# Patient Record
Sex: Female | Born: 1950 | Race: White | Hispanic: No | Smoking: Former smoker
Health system: Southern US, Community
[De-identification: ages and names within clinical notes are randomized; demographics above are authoritative.]

## PROBLEM LIST (undated history)

## (undated) DIAGNOSIS — M1990 Unspecified osteoarthritis, unspecified site: Secondary | ICD-10-CM

## (undated) DIAGNOSIS — F32A Depression, unspecified: Secondary | ICD-10-CM

## (undated) DIAGNOSIS — F419 Anxiety disorder, unspecified: Secondary | ICD-10-CM

## (undated) DIAGNOSIS — Z8719 Personal history of other diseases of the digestive system: Secondary | ICD-10-CM

## (undated) DIAGNOSIS — K219 Gastro-esophageal reflux disease without esophagitis: Secondary | ICD-10-CM

## (undated) DIAGNOSIS — J449 Chronic obstructive pulmonary disease, unspecified: Secondary | ICD-10-CM

## (undated) DIAGNOSIS — G2581 Restless legs syndrome: Secondary | ICD-10-CM

## (undated) HISTORY — PX: BREAST SURGERY: SHX581

## (undated) HISTORY — PX: JOINT REPLACEMENT: SHX530

## (undated) HISTORY — PX: CHOLECYSTECTOMY: SHX55

## (undated) HISTORY — PX: TONSILLECTOMY: SUR1361

---

## 1988-05-08 HISTORY — PX: BREAST EXCISIONAL BIOPSY: SUR124

## 2005-01-31 ENCOUNTER — Ambulatory Visit: Payer: Self-pay | Admitting: Cardiology

## 2010-07-19 ENCOUNTER — Ambulatory Visit (INDEPENDENT_AMBULATORY_CARE_PROVIDER_SITE_OTHER): Payer: Self-pay | Admitting: Internal Medicine

## 2010-08-11 ENCOUNTER — Ambulatory Visit (INDEPENDENT_AMBULATORY_CARE_PROVIDER_SITE_OTHER): Payer: Self-pay | Admitting: Internal Medicine

## 2019-09-17 ENCOUNTER — Other Ambulatory Visit: Payer: Self-pay

## 2019-09-17 ENCOUNTER — Ambulatory Visit (INDEPENDENT_AMBULATORY_CARE_PROVIDER_SITE_OTHER): Payer: Medicare Other | Admitting: Orthopaedic Surgery

## 2019-09-17 VITALS — Ht 66.0 in | Wt 205.0 lb

## 2019-09-17 DIAGNOSIS — M25551 Pain in right hip: Secondary | ICD-10-CM | POA: Diagnosis not present

## 2019-09-17 NOTE — Progress Notes (Signed)
Office Visit Note   Patient: [name]           Date of Birth: 1951/03/25           MRN: [id number] Visit Date: 09/17/2019              Requested by: No referring provider defined for this encounter. PCP: Glenda Chroman, MD   Assessment & Plan: Visit Diagnoses:  1. 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  I do feel it would be worthwhile trying an intra-articular steroid injection under ultrasound and I will refer her to Dr. Junius Roads for this.  He can then get her back to me about 2 weeks after that injection.  She agrees with this treatment plan.  I have encouraged her to use the cane in her opposite hand as well.  All questions and concerns were answered and addressed.  If she continues to have severe pain after the injection in her hip, we will consider a MRI of the right hip to better assess the degree of arthritis she has.  Follow-Up Instructions: Return in about 4 weeks (around 10/15/2019).   Orders:  No orders of the defined types were placed in this encounter.  No orders of the defined types were placed in this encounter.     Procedures: No procedures performed   Clinical Data: No additional findings.   Subjective: Chief Complaint  Patient presents with  . Right Hip - Pain  The patient is someone I am seeing for the first time.  She has had right hip pain for only about 3 and half months and is here to discuss surgery although we do not truly know the diagnosis.  There are x-rays on the canopy system for me to review.  She has a history of chronic bilateral knee pain.  She has had an injection over the trochanteric area of her right hip but she does report more pain in her groin.  She ambulates using a cane but on the same side as her right hip pain.  Again her hip pain is only been acute just since February with no known injury.  She is not a diabetic  HPI  Review of  Systems She currently denies any headache, chest pain, shortness of breath, fever, chills, nausea, vomiting  Objective: Vital Signs: Ht 5\' 6"  (1.676 m)   Wt 205 lb (93 kg)   BMI 33.09 kg/m   Physical Exam She is alert and orient x3 and in no acute distress Ortho Exam Examination of both hips shows that I can easily move into internal and external rotation with no blocks to rotation.  She has a lot of pain out of proportion of exam when I put her hip on the right side the range of motion on not sure if some of this is over the trochanteric area and not truly in the groin.  Again, there are no blocks to rotation of the right hip.  There is no stiffness. Specialty Comments:  No specialty comments available.  Imaging: No results found. I did review x-rays independently on the canopy system of her hips.  There is only minimal arthritic findings in the right hip with a very small periarticular osteophyte and just a touch of joint space narrowing.  The femoral head is nice and round and congruent with the joint itself.  PMFS History: There are no problems to display for this patient.  No past medical history on file.  No family history on file.   Social History   Occupational History  . Not on file  Tobacco Use  . Smoking status: Not on file  Substance and Sexual Activity  . Alcohol use: Not on file  . Drug use: Not on file  . Sexual activity: Not on file

## 2019-09-22 ENCOUNTER — Ambulatory Visit: Payer: Medicare Other | Admitting: Family Medicine

## 2019-09-26 ENCOUNTER — Ambulatory Visit: Payer: Medicare Other | Admitting: Family Medicine

## 2019-09-29 ENCOUNTER — Ambulatory Visit (INDEPENDENT_AMBULATORY_CARE_PROVIDER_SITE_OTHER): Payer: Medicare Other | Admitting: Family Medicine

## 2019-09-29 ENCOUNTER — Other Ambulatory Visit: Payer: Self-pay

## 2019-09-29 ENCOUNTER — Ambulatory Visit: Payer: Self-pay

## 2019-09-29 ENCOUNTER — Encounter: Payer: Self-pay | Admitting: Family Medicine

## 2019-09-29 DIAGNOSIS — M25551 Pain in right hip: Secondary | ICD-10-CM

## 2019-09-29 NOTE — Progress Notes (Signed)
Subjective: Patient is here for ultrasound-guided intra-articular right hip injection.   She is here for a diagnostic injection.  She had a greater trochanter injection in the past which only helped during the anesthetic phase.  She complains of a lot of pain in the groin area.  Objective: She does have substantial pain with passive flexion and internal rotation.  Procedure: Ultrasound-guided right hip injection: After sterile prep with Betadine, injected 8 cc 1% lidocaine without epinephrine and 40 mg methylprednisolone using a 22-gauge spinal needle, passing the needle through the iliofemoral ligament into the femoral head/neck junction.  Injectate was seen filling the joint capsule.  She had a small joint effusion at the time of the injection.  She had very good immediate relief.  She will follow-up with Dr. Magnus Ivan as scheduled.

## 2019-10-29 ENCOUNTER — Ambulatory Visit (INDEPENDENT_AMBULATORY_CARE_PROVIDER_SITE_OTHER): Payer: Medicare Other | Admitting: Orthopaedic Surgery

## 2019-10-29 ENCOUNTER — Other Ambulatory Visit: Payer: Self-pay

## 2019-10-29 ENCOUNTER — Ambulatory Visit: Payer: Self-pay

## 2019-10-29 ENCOUNTER — Ambulatory Visit (INDEPENDENT_AMBULATORY_CARE_PROVIDER_SITE_OTHER): Payer: Medicare Other

## 2019-10-29 DIAGNOSIS — M25561 Pain in right knee: Secondary | ICD-10-CM

## 2019-10-29 DIAGNOSIS — M25562 Pain in left knee: Secondary | ICD-10-CM

## 2019-10-29 DIAGNOSIS — G8929 Other chronic pain: Secondary | ICD-10-CM

## 2019-10-29 DIAGNOSIS — M1711 Unilateral primary osteoarthritis, right knee: Secondary | ICD-10-CM | POA: Diagnosis not present

## 2019-10-29 DIAGNOSIS — M1712 Unilateral primary osteoarthritis, left knee: Secondary | ICD-10-CM

## 2019-10-29 NOTE — Progress Notes (Signed)
Office Visit Note   Patient: [name]           Date of Birth: 1951-03-31           MRN: [id number] Visit Date: 10/29/2019              Requested by: Glenda Chroman, MD [place],  [place] [address] PCP: Glenda Chroman, MD   Assessment & Plan: Visit Diagnoses:  1. Chronic pain of left knee   2. Chronic pain of right knee   3. Unilateral primary osteoarthritis, left knee   4. Unilateral primary osteoarthritis, right knee     Plan: Given the severity of the arthritis in her left knee combined with the significant pain on clinical exam and her x-ray findings showing severe end-stage arthritis with medial compartment bone-on-bone wear, we are recommending a total knee arthroplasty for the left knee.  She may need this for the right knee in the future but right now the right knee is more unstable to her that it is painful.  I have recommended a left total knee arthroplasty.  I showed her knee replacement model and explained in detail the interoperative and postoperative course.  I talked about the risk and benefits of surgery in detail.  All questions and concerns were answered and addressed.  We will work on getting this scheduled for her left knee in the near future.  Follow-Up Instructions: Return for 2 weeks post-op.   Orders:  Orders Placed This Encounter  Procedures  . XR Knee 1-2 Views Left  . XR Knee 1-2 Views Right   No orders of the defined types were placed in this encounter.     Procedures: No procedures performed   Clinical Data: No additional findings.   Subjective: Chief Complaint  Patient presents with  . Right Hip - Follow-up  . Right Knee - Pain  . Left Knee - Pain  The patient comes in today with chief complaint of bilateral knee pain with the left knee much worse than the right knee in terms of pain.  The right knee has more instability than pain.  She has had numerous steroid injections in her knees over the past.  She ambulates using a  cane.  She is also had bilateral knee hyaluronic acid injections.  At this point her knee pain is become daily and it is detrimentally affecting her mobility, her quality of life and her actives daily living.  She is work on activity modification as well as weight loss and has been to therapy with quad strengthening exercises.  She says the injections not helping anymore and her pain is daily and is hurting basically all the time.  It is 10 out of 10 in terms of her left knee pain.  This is worsened over the last 12 months.  She is not on any blood thinning medication.  She is otherwise healthy 69 year old female who wants to be active again.  HPI  Review of Systems She currently denies any headache, chest pain, shortness of breath, fever, chills, nausea, vomiting  Objective: Vital Signs: There were no vitals taken for this visit.  Physical Exam She is alert and oriented x3 and in no acute distress Ortho Exam Examination of both knees shows varus malalignment.  The left knee is much more tender along the medial joint line but the right knee does have medial joint line tenderness.  Both knees are ligamentously stable.  Both knees have significant patellofemoral crepitation.  Specialty Comments:  No specialty comments available.  Imaging: XR Knee 1-2 Views Left  Result Date: 10/29/2019 2 views of the left knee show severe end-stage arthritis with varus malalignment, complete loss of medial joint space and large peritracheal osteophytes.  XR Knee 1-2 Views Right  Result Date: 10/29/2019 2 views of the right knee show severe arthritic changes with varus malalignment, medial joint space narrowing with almost bone-on-bone wear and large periarticular osteophytes.    PMFS History: Patient Active Problem List   Diagnosis Date Noted  . Unilateral primary osteoarthritis, left knee 10/29/2019  . Unilateral primary osteoarthritis, right knee 10/29/2019   No past medical history on file.  No  family history on file.  No past surgical history on file. Social History   Occupational History  . Not on file  Tobacco Use  . Smoking status: Not on file  Substance and Sexual Activity  . Alcohol use: Not on file  . Drug use: Not on file  . Sexual activity: Not on file

## 2019-10-30 ENCOUNTER — Other Ambulatory Visit: Payer: Self-pay

## 2019-12-05 ENCOUNTER — Other Ambulatory Visit: Payer: Self-pay | Admitting: Physician Assistant

## 2019-12-12 ENCOUNTER — Other Ambulatory Visit (HOSPITAL_COMMUNITY)
Admission: RE | Admit: 2019-12-12 | Discharge: 2019-12-12 | Disposition: A | Discharge status: Home / Self Care | Payer: Medicare Other | Source: Ambulatory Visit | Attending: Orthopaedic Surgery | Admitting: Orthopaedic Surgery

## 2019-12-12 ENCOUNTER — Encounter (HOSPITAL_COMMUNITY)
Admission: RE | Admit: 2019-12-12 | Discharge: 2019-12-12 | Disposition: A | Discharge status: Home / Self Care | Payer: Medicare Other | Source: Ambulatory Visit | Attending: Orthopaedic Surgery | Admitting: Orthopaedic Surgery

## 2019-12-12 ENCOUNTER — Encounter (HOSPITAL_COMMUNITY): Payer: Self-pay

## 2019-12-12 ENCOUNTER — Other Ambulatory Visit: Payer: Self-pay

## 2019-12-12 DIAGNOSIS — Z20822 Contact with and (suspected) exposure to covid-19: Secondary | ICD-10-CM | POA: Insufficient documentation

## 2019-12-12 DIAGNOSIS — Z01812 Encounter for preprocedural laboratory examination: Secondary | ICD-10-CM | POA: Insufficient documentation

## 2019-12-12 HISTORY — DX: Unspecified osteoarthritis, unspecified site: M19.90

## 2019-12-12 HISTORY — DX: Gastro-esophageal reflux disease without esophagitis: K21.9

## 2019-12-12 HISTORY — DX: Depression, unspecified: F32.A

## 2019-12-12 HISTORY — DX: Chronic obstructive pulmonary disease, unspecified: J44.9

## 2019-12-12 HISTORY — DX: Restless legs syndrome: G25.81

## 2019-12-12 HISTORY — DX: Anxiety disorder, unspecified: F41.9

## 2019-12-12 HISTORY — DX: Personal history of other diseases of the digestive system: Z87.19

## 2019-12-12 LAB — SURGICAL PCR SCREEN
MRSA, PCR: NEGATIVE
Staphylococcus aureus: POSITIVE — AB

## 2019-12-12 LAB — TYPE AND SCREEN
ABO/RH(D): A POS
Antibody Screen: NEGATIVE

## 2019-12-12 LAB — CBC
HCT: 41.9 % (ref 36.0–46.0)
Hemoglobin: 14.3 g/dL (ref 12.0–15.0)
MCH: 31.8 pg (ref 26.0–34.0)
MCHC: 34.1 g/dL (ref 30.0–36.0)
MCV: 93.1 fL (ref 80.0–100.0)
Platelets: UNDETERMINED 10*3/uL (ref 150–400)
RBC: 4.5 MIL/uL (ref 3.87–5.11)
RDW: 12.2 % (ref 11.5–15.5)
WBC: 8 10*3/uL (ref 4.0–10.5)
nRBC: 0 % (ref 0.0–0.2)

## 2019-12-12 LAB — BASIC METABOLIC PANEL
Anion gap: 11 (ref 5–15)
BUN: 14 mg/dL (ref 8–23)
CO2: 25 mmol/L (ref 22–32)
Calcium: 9.8 mg/dL (ref 8.9–10.3)
Chloride: 105 mmol/L (ref 98–111)
Creatinine, Ser: 0.85 mg/dL (ref 0.44–1.00)
GFR calc Af Amer: >60 mL/min (ref >60)
GFR calc non Af Amer: >60 mL/min (ref >60)
Glucose, Bld: 90 mg/dL (ref 70–99)
Potassium: 3.9 mmol/L (ref 3.5–5.1)
Sodium: 141 mmol/L (ref 135–145)

## 2019-12-12 LAB — SARS CORONAVIRUS 2 (TAT 6-24 HRS): SARS Coronavirus 2: NEGATIVE

## 2019-12-12 NOTE — Progress Notes (Signed)
PCP - DR Sherril Croon  IN [place]    AWARE OF SURGERY Cardiologist - NA     Chest x-ray - NA EKG - NA     Sleep Study - YES CPAP -  NO    Aspirin Instructions:STOP  ERAS Protcol -YES PRE-SURGERY Ensure or G2- INSTRUCTIONS GIVEN  COVID TEST- DONE TODAY   Anesthesia review: NA  Patient denies shortness of breath, fever, cough and chest pain at PAT appointment   All instructions explained to the patient, with a verbal understanding of the material. Patient agrees to go over the instructions while at home for a better understanding. Patient also instructed to self quarantine after being tested for COVID-19. The opportunity to ask questions was provided.

## 2019-12-12 NOTE — Pre-Procedure Instructions (Signed)
Rhae Lerner [facility]  12/12/2019      [facility] [address] [place] - [address] [place] [address] Phone: [phone] Fax: [phone]  [facility] [place], [place] - [address] [place], [address] [place], [address] [place]  [address] Phone: [phone] Fax: [phone]    Your procedure is scheduled on 12/16/19.  Report to [facility] Admitting at 10 A.M.  Call this number if you have problems the morning of surgery:  [phone]   Remember:  Do not eat or drink after midnight.  You may drink clear liquids until 9 am .  Clear liquids allowed are:                    Water, Juice (non-citric and without pulp - diabetics please choose diet or no sugar options), Carbonated beverages - (diabetics please choose diet or no sugar options), Clear Tea, Black Coffee only (no creamer, milk or cream including half and half) and Plain Jell-O only (diabetics please choose diet or no sugar options)    Take these medicines the morning of surgery with A SIP OF WATER ---  TYLENOL,WELLBUTRIN,HYDROCODONE,PROTONIX    Do not wear jewelry, make-up or nail polish.  Do not wear lotions, powders, or perfumes, or deodorant.  Do not shave 48 hours prior to surgery.  Men may shave face and neck.  Do not bring valuables to the hospital.  [facility] is not responsible for any belongings or valuables.  Contacts, dentures or bridgework may not be worn into surgery.  Leave your suitcase in the car.  After surgery it may be brought to your room.  For patients admitted to the hospital, discharge time will be determined by your treatment team.  Patients discharged the day of surgery will not be allowed to drive home.    Special instructions:  Do not take any aspirin,anti-inflammatories,vitamins,or herbal supplements 5-7 days prior to surgery.Please complete your PRE-SURGERY ENSURE that was provided to you by ... the morning of surgery.   Please, if able, drink it in one setting. DO NOT SIP.[place] - Preparing for Surgery  Before surgery, you can play an important role.  Because skin is not sterile, your skin needs to be as free of germs as possible.  You can reduce the number of germs on you skin by washing with CHG (chlorahexidine gluconate) soap before surgery.  CHG is an antiseptic cleaner which kills germs and bonds with the skin to continue killing germs even after washing.  Oral Hygiene is also important in reducing the risk of infection.  Remember to brush your teeth with your regular toothpaste the morning of surgery.  Please DO NOT use if you have an allergy to CHG or antibacterial soaps.  If your skin becomes reddened/irritated stop using the CHG and inform your nurse when you arrive at Short Stay.  Do not shave (including legs and underarms) for at least 48 hours prior to the first CHG shower.  You may shave your face.  Please follow these instructions carefully:   1.  Shower with CHG Soap the night before surgery and the morning of Surgery.  2.  If you choose to wash your hair, wash your hair first as usual with your normal shampoo.  3.  After you shampoo, rinse your hair and body thoroughly to remove the shampoo. 4.  Use CHG as you would any other liquid soap.  You  can apply chg directly to the skin and wash gently with a      scrungie or washcloth.           5.  Apply the CHG Soap to your body ONLY FROM THE NECK DOWN.   Do not use on open wounds or open sores. Avoid contact with your eyes, ears, mouth and genitals (private parts).  Wash genitals (private parts) with your normal soap.  6.  Wash thoroughly, paying special attention to the area where your surgery will be performed.  7.  Thoroughly rinse your body with warm water from the neck down.  8.  DO NOT shower/wash with your normal soap after using and rinsing off the CHG Soap.  9.  Pat yourself dry with a clean towel.            10.  Wear clean pajamas.             11.  Place clean sheets on your bed the night of your first shower and do not sleep with pets.  Day of Surgery  Do not apply any lotions/deoderants the morning of surgery.   Please wear clean clothes to the hospital/surgery center. Remember to brush your teeth with toothpaste.    Please read over the following fact sheets that you were given. Coughing and Deep Breathing

## 2019-12-15 NOTE — Anesthesia Preprocedure Evaluation (Addendum)
Anesthesia Evaluation  Patient identified by MRN, date of birth, ID band Patient awake    Reviewed: Allergy & Precautions, NPO status , Patient's Chart, lab work & pertinent test results  Airway Mallampati: II  TM Distance: >3 FB Neck ROM: Full    Dental no notable dental hx. (+) Teeth Intact, Dental Advisory Given   Pulmonary COPD,  COPD inhaler, Current Smoker and Patient abstained from smoking.,  Current smoker, 20 pack year history- 4 cigg/d Nebulizer PRN- last used about 6 mo ago   Pulmonary exam normal breath sounds clear to auscultation       Cardiovascular negative cardio ROS Normal cardiovascular exam Rhythm:Regular Rate:Normal     Neuro/Psych PSYCHIATRIC DISORDERS Anxiety Depression Dementia negative neurological ROS     GI/Hepatic Neg liver ROS, hiatal hernia, GERD  Medicated and Controlled,  Endo/Other  Obesity BMI 33  Renal/GU negative Renal ROS  negative genitourinary   Musculoskeletal  (+) Arthritis , Osteoarthritis,    Abdominal   Peds  Hematology hct 41.9, plt clumped on initial CBC- will redraw this AM   Anesthesia Other Findings   Reproductive/Obstetrics negative OB ROS                            Anesthesia Physical Anesthesia Plan  ASA: II  Anesthesia Plan: MAC and General   Post-op Pain Management:  Regional for Post-op pain   Induction:   PONV Risk Score and Plan: 2 and Ondansetron, Dexamethasone, Treatment may vary due to age or medical condition and Midazolam  Airway Management Planned: Oral ETT  Additional Equipment: None  Intra-op Plan:   Post-operative Plan: Extubation in OR  Informed Consent: I have reviewed the patients History and Physical, chart, labs and discussed the procedure including the risks, benefits and alternatives for the proposed anesthesia with the patient or authorized representative who has indicated his/her understanding and  acceptance.     Dental advisory given  Plan Discussed with: CRNA  Anesthesia Plan Comments: (Pt electing for GA/ETT)       Anesthesia Quick Evaluation

## 2019-12-16 ENCOUNTER — Encounter (HOSPITAL_COMMUNITY): Payer: Self-pay | Admitting: Orthopaedic Surgery

## 2019-12-16 ENCOUNTER — Ambulatory Visit (HOSPITAL_COMMUNITY): Payer: Medicare Other | Admitting: Anesthesiology

## 2019-12-16 ENCOUNTER — Encounter (HOSPITAL_COMMUNITY)
Admission: RE | Disposition: A | Discharge status: Home Health | Payer: Self-pay | Source: Home / Self Care | Attending: Orthopaedic Surgery

## 2019-12-16 ENCOUNTER — Inpatient Hospital Stay (HOSPITAL_COMMUNITY)
Admission: RE | Admit: 2019-12-16 | Discharge: 2019-12-18 | DRG: 470 | Disposition: A | Discharge status: Home Health | Payer: Medicare Other | Attending: Orthopaedic Surgery | Admitting: Orthopaedic Surgery

## 2019-12-16 ENCOUNTER — Observation Stay (HOSPITAL_COMMUNITY): Payer: Medicare Other

## 2019-12-16 ENCOUNTER — Other Ambulatory Visit: Payer: Self-pay

## 2019-12-16 DIAGNOSIS — E669 Obesity, unspecified: Secondary | ICD-10-CM | POA: Diagnosis present

## 2019-12-16 DIAGNOSIS — M1712 Unilateral primary osteoarthritis, left knee: Secondary | ICD-10-CM | POA: Diagnosis not present

## 2019-12-16 DIAGNOSIS — F329 Major depressive disorder, single episode, unspecified: Secondary | ICD-10-CM | POA: Diagnosis present

## 2019-12-16 DIAGNOSIS — K219 Gastro-esophageal reflux disease without esophagitis: Secondary | ICD-10-CM | POA: Diagnosis present

## 2019-12-16 DIAGNOSIS — Z20822 Contact with and (suspected) exposure to covid-19: Secondary | ICD-10-CM | POA: Diagnosis present

## 2019-12-16 DIAGNOSIS — F17210 Nicotine dependence, cigarettes, uncomplicated: Secondary | ICD-10-CM | POA: Diagnosis present

## 2019-12-16 DIAGNOSIS — G2581 Restless legs syndrome: Secondary | ICD-10-CM | POA: Diagnosis present

## 2019-12-16 DIAGNOSIS — Z23 Encounter for immunization: Secondary | ICD-10-CM

## 2019-12-16 DIAGNOSIS — Z6832 Body mass index (BMI) 32.0-32.9, adult: Secondary | ICD-10-CM

## 2019-12-16 DIAGNOSIS — J449 Chronic obstructive pulmonary disease, unspecified: Secondary | ICD-10-CM | POA: Diagnosis present

## 2019-12-16 DIAGNOSIS — Z96652 Presence of left artificial knee joint: Secondary | ICD-10-CM

## 2019-12-16 HISTORY — PX: TOTAL KNEE ARTHROPLASTY: SHX125

## 2019-12-16 LAB — ABO/RH: ABO/RH(D): A POS

## 2019-12-16 SURGERY — ARTHROPLASTY, KNEE, TOTAL
Anesthesia: Monitor Anesthesia Care | Site: Knee | Laterality: Left

## 2019-12-16 MED ORDER — HYDROMORPHONE HCL 1 MG/ML IJ SOLN
INTRAMUSCULAR | Status: AC
  Filled 2019-12-16: qty 0.5

## 2019-12-16 MED ORDER — LACTATED RINGERS IV SOLN
INTRAVENOUS | Status: DC | PRN
Start: 2019-12-16 — End: 2019-12-16

## 2019-12-16 MED ORDER — TRANEXAMIC ACID-NACL 1000-0.7 MG/100ML-% IV SOLN
1000.0000 mg | INTRAVENOUS | Status: AC
  Administered 2019-12-16: 1000 mg via INTRAVENOUS
  Filled 2019-12-16: qty 100

## 2019-12-16 MED ORDER — VITAMIN D 25 MCG (1000 UNIT) PO TABS
2000.0000 [IU] | ORAL_TABLET | Freq: Every day | ORAL | Status: DC
  Administered 2019-12-16 – 2019-12-18 (×3): 2000 [IU] via ORAL
  Filled 2019-12-16 (×5): qty 2

## 2019-12-16 MED ORDER — ONDANSETRON HCL 4 MG/2ML IJ SOLN: 4.0000 mg | Freq: Four times a day (QID) | INTRAMUSCULAR | Status: DC | PRN

## 2019-12-16 MED ORDER — SODIUM CHLORIDE 0.9 % IV SOLN: INTRAVENOUS | Status: DC

## 2019-12-16 MED ORDER — MIDAZOLAM HCL 2 MG/2ML IJ SOLN
INTRAMUSCULAR | Status: DC | PRN
  Administered 2019-12-16 (×2): 1 mg via INTRAVENOUS

## 2019-12-16 MED ORDER — OXYCODONE HCL 5 MG PO TABS
10.0000 mg | ORAL_TABLET | ORAL | Status: DC | PRN
  Administered 2019-12-16: 10 mg via ORAL
  Administered 2019-12-17 – 2019-12-18 (×8): 15 mg via ORAL
  Filled 2019-12-16 (×8): qty 3

## 2019-12-16 MED ORDER — KETOROLAC TROMETHAMINE 15 MG/ML IJ SOLN
15.0000 mg | Freq: Four times a day (QID) | INTRAMUSCULAR | Status: AC
  Administered 2019-12-16 – 2019-12-17 (×4): 15 mg via INTRAVENOUS
  Filled 2019-12-16 (×4): qty 1

## 2019-12-16 MED ORDER — PROMETHAZINE HCL 25 MG/ML IJ SOLN
6.2500 mg | INTRAMUSCULAR | Status: DC | PRN
  Administered 2019-12-16: 12.5 mg via INTRAVENOUS

## 2019-12-16 MED ORDER — OXYCODONE HCL 5 MG PO TABS
5.0000 mg | ORAL_TABLET | ORAL | Status: DC | PRN
  Administered 2019-12-16 – 2019-12-17 (×2): 10 mg via ORAL
  Filled 2019-12-16 (×3): qty 2

## 2019-12-16 MED ORDER — OXYCODONE HCL 5 MG PO TABS: 5.0000 mg | ORAL_TABLET | Freq: Once | ORAL | Status: DC | PRN

## 2019-12-16 MED ORDER — FENTANYL CITRATE (PF) 250 MCG/5ML IJ SOLN
INTRAMUSCULAR | Status: AC
  Filled 2019-12-16: qty 5

## 2019-12-16 MED ORDER — CEFAZOLIN SODIUM-DEXTROSE 1-4 GM/50ML-% IV SOLN
1.0000 g | Freq: Four times a day (QID) | INTRAVENOUS | Status: AC
  Administered 2019-12-16: 1 g via INTRAVENOUS
  Filled 2019-12-16 (×2): qty 50

## 2019-12-16 MED ORDER — ACETAMINOPHEN 500 MG PO TABS
1000.0000 mg | ORAL_TABLET | Freq: Once | ORAL | Status: AC
  Administered 2019-12-16: 1000 mg via ORAL
  Filled 2019-12-16: qty 2

## 2019-12-16 MED ORDER — ACETAMINOPHEN 325 MG PO TABS: 325.0000 mg | ORAL_TABLET | Freq: Four times a day (QID) | ORAL | Status: DC | PRN

## 2019-12-16 MED ORDER — ONDANSETRON HCL 4 MG/2ML IJ SOLN
INTRAMUSCULAR | Status: DC | PRN
  Administered 2019-12-16: 4 mg via INTRAVENOUS

## 2019-12-16 MED ORDER — HYDROMORPHONE HCL 1 MG/ML IJ SOLN
INTRAMUSCULAR | Status: DC | PRN
  Administered 2019-12-16: .5 mg via INTRAVENOUS

## 2019-12-16 MED ORDER — HYDROMORPHONE HCL 1 MG/ML IJ SOLN
0.2500 mg | INTRAMUSCULAR | Status: DC | PRN
  Administered 2019-12-16 (×2): 0.5 mg via INTRAVENOUS

## 2019-12-16 MED ORDER — CEFAZOLIN SODIUM-DEXTROSE 2-4 GM/100ML-% IV SOLN
2.0000 g | INTRAVENOUS | Status: AC
  Administered 2019-12-16: 2 g via INTRAVENOUS
  Filled 2019-12-16: qty 100

## 2019-12-16 MED ORDER — CHLORHEXIDINE GLUCONATE 0.12 % MT SOLN
OROMUCOSAL | Status: AC
  Administered 2019-12-16: 15 mL
  Filled 2019-12-16: qty 15

## 2019-12-16 MED ORDER — POVIDONE-IODINE 10 % EX SWAB: 2.0000 "application " | Freq: Once | CUTANEOUS | Status: DC

## 2019-12-16 MED ORDER — MUPIROCIN 2 % EX OINT
1.0000 "application " | TOPICAL_OINTMENT | Freq: Two times a day (BID) | CUTANEOUS | Status: DC
  Administered 2019-12-16 – 2019-12-18 (×4): 1 via NASAL
  Filled 2019-12-16: qty 22

## 2019-12-16 MED ORDER — BACLOFEN 10 MG PO TABS
10.0000 mg | ORAL_TABLET | Freq: Two times a day (BID) | ORAL | Status: DC | PRN
  Administered 2019-12-16 – 2019-12-17 (×2): 10 mg via ORAL
  Filled 2019-12-16 (×4): qty 1

## 2019-12-16 MED ORDER — BUPROPION HCL ER (XL) 150 MG PO TB24
150.0000 mg | ORAL_TABLET | Freq: Every morning | ORAL | Status: DC
  Administered 2019-12-17 – 2019-12-18 (×2): 150 mg via ORAL
  Filled 2019-12-16 (×2): qty 1

## 2019-12-16 MED ORDER — PANTOPRAZOLE SODIUM 40 MG PO TBEC
40.0000 mg | DELAYED_RELEASE_TABLET | Freq: Every day | ORAL | Status: DC
  Administered 2019-12-16 – 2019-12-18 (×3): 40 mg via ORAL
  Filled 2019-12-16 (×3): qty 1

## 2019-12-16 MED ORDER — METOCLOPRAMIDE HCL 5 MG/ML IJ SOLN: 5.0000 mg | Freq: Three times a day (TID) | INTRAMUSCULAR | Status: DC | PRN

## 2019-12-16 MED ORDER — CHLORHEXIDINE GLUCONATE CLOTH 2 % EX PADS
6.0000 | MEDICATED_PAD | Freq: Every day | CUTANEOUS | Status: DC
  Administered 2019-12-17 – 2019-12-18 (×2): 6 via TOPICAL

## 2019-12-16 MED ORDER — ONDANSETRON HCL 4 MG PO TABS: 4.0000 mg | ORAL_TABLET | Freq: Four times a day (QID) | ORAL | Status: DC | PRN

## 2019-12-16 MED ORDER — HYDROMORPHONE HCL 1 MG/ML IJ SOLN
INTRAMUSCULAR | Status: AC
  Administered 2019-12-16: 0.5 mg via INTRAVENOUS
  Filled 2019-12-16: qty 1

## 2019-12-16 MED ORDER — ROSUVASTATIN CALCIUM 5 MG PO TABS
10.0000 mg | ORAL_TABLET | ORAL | Status: DC
  Administered 2019-12-17: 10 mg via ORAL
  Filled 2019-12-16: qty 2

## 2019-12-16 MED ORDER — MIDAZOLAM HCL 2 MG/2ML IJ SOLN
INTRAMUSCULAR | Status: AC
  Filled 2019-12-16: qty 2

## 2019-12-16 MED ORDER — FENTANYL CITRATE (PF) 250 MCG/5ML IJ SOLN
INTRAMUSCULAR | Status: DC | PRN
  Administered 2019-12-16 (×2): 25 ug via INTRAVENOUS
  Administered 2019-12-16 (×2): 50 ug via INTRAVENOUS
  Administered 2019-12-16: 100 ug via INTRAVENOUS

## 2019-12-16 MED ORDER — ROPINIROLE HCL 0.5 MG PO TABS
1.0000 mg | ORAL_TABLET | Freq: Two times a day (BID) | ORAL | Status: DC
  Administered 2019-12-16 – 2019-12-18 (×4): 1 mg via ORAL
  Filled 2019-12-16 (×4): qty 2

## 2019-12-16 MED ORDER — 0.9 % SODIUM CHLORIDE (POUR BTL) OPTIME
TOPICAL | Status: DC | PRN
  Administered 2019-12-16: 1000 mL

## 2019-12-16 MED ORDER — METOCLOPRAMIDE HCL 5 MG PO TABS: 5.0000 mg | ORAL_TABLET | Freq: Three times a day (TID) | ORAL | Status: DC | PRN

## 2019-12-16 MED ORDER — PROMETHAZINE HCL 25 MG/ML IJ SOLN
INTRAMUSCULAR | Status: AC
  Filled 2019-12-16: qty 1

## 2019-12-16 MED ORDER — FENTANYL CITRATE (PF) 100 MCG/2ML IJ SOLN
INTRAMUSCULAR | Status: AC
  Administered 2019-12-16: 25 ug via INTRAVENOUS
  Filled 2019-12-16: qty 2

## 2019-12-16 MED ORDER — PNEUMOCOCCAL VAC POLYVALENT 25 MCG/0.5ML IJ INJ
0.5000 mL | INJECTION | INTRAMUSCULAR | Status: AC
  Administered 2019-12-18: 0.5 mL via INTRAMUSCULAR
  Filled 2019-12-16: qty 0.5

## 2019-12-16 MED ORDER — PROPOFOL 10 MG/ML IV BOLUS
INTRAVENOUS | Status: DC | PRN
  Administered 2019-12-16: 150 mg via INTRAVENOUS

## 2019-12-16 MED ORDER — PHENYLEPHRINE 40 MCG/ML (10ML) SYRINGE FOR IV PUSH (FOR BLOOD PRESSURE SUPPORT)
PREFILLED_SYRINGE | INTRAVENOUS | Status: DC | PRN
  Administered 2019-12-16: 80 ug via INTRAVENOUS

## 2019-12-16 MED ORDER — PROPOFOL 10 MG/ML IV BOLUS
INTRAVENOUS | Status: AC
  Filled 2019-12-16: qty 20

## 2019-12-16 MED ORDER — PHENOL 1.4 % MT LIQD: 1.0000 | OROMUCOSAL | Status: DC | PRN

## 2019-12-16 MED ORDER — LIDOCAINE 2% (20 MG/ML) 5 ML SYRINGE
INTRAMUSCULAR | Status: DC | PRN
  Administered 2019-12-16: 20 mg via INTRAVENOUS

## 2019-12-16 MED ORDER — ALUM & MAG HYDROXIDE-SIMETH 200-200-20 MG/5ML PO SUSP: 30.0000 mL | ORAL | Status: DC | PRN

## 2019-12-16 MED ORDER — OXYCODONE HCL 5 MG PO TABS
ORAL_TABLET | ORAL | Status: AC
  Administered 2019-12-16: 10 mg via ORAL
  Filled 2019-12-16: qty 2

## 2019-12-16 MED ORDER — ROCURONIUM BROMIDE 10 MG/ML (PF) SYRINGE
PREFILLED_SYRINGE | INTRAVENOUS | Status: DC | PRN
  Administered 2019-12-16: 50 mg via INTRAVENOUS

## 2019-12-16 MED ORDER — CARBIDOPA-LEVODOPA 25-100 MG PO TABS
1.0000 | ORAL_TABLET | Freq: Two times a day (BID) | ORAL | Status: DC
  Administered 2019-12-16 – 2019-12-18 (×4): 1 via ORAL
  Filled 2019-12-16 (×4): qty 1

## 2019-12-16 MED ORDER — FENTANYL CITRATE (PF) 100 MCG/2ML IJ SOLN
INTRAMUSCULAR | Status: AC
  Filled 2019-12-16: qty 2

## 2019-12-16 MED ORDER — FENTANYL CITRATE (PF) 100 MCG/2ML IJ SOLN
25.0000 ug | INTRAMUSCULAR | Status: DC | PRN
  Administered 2019-12-16: 25 ug via INTRAVENOUS

## 2019-12-16 MED ORDER — SUGAMMADEX SODIUM 200 MG/2ML IV SOLN
INTRAVENOUS | Status: DC | PRN
  Administered 2019-12-16: 150 mg via INTRAVENOUS

## 2019-12-16 MED ORDER — DEXAMETHASONE SODIUM PHOSPHATE 10 MG/ML IJ SOLN
INTRAMUSCULAR | Status: DC | PRN
Start: 2019-12-16 — End: 2019-12-16
  Administered 2019-12-16: 10 mg

## 2019-12-16 MED ORDER — ACETAMINOPHEN 500 MG PO TABS
500.0000 mg | ORAL_TABLET | Freq: Three times a day (TID) | ORAL | Status: DC | PRN
  Administered 2019-12-17: 1000 mg via ORAL
  Filled 2019-12-16: qty 2

## 2019-12-16 MED ORDER — DEXAMETHASONE SODIUM PHOSPHATE 10 MG/ML IJ SOLN
INTRAMUSCULAR | Status: DC | PRN
  Administered 2019-12-16: 5 mg

## 2019-12-16 MED ORDER — LORAZEPAM 0.5 MG PO TABS
0.5000 mg | ORAL_TABLET | Freq: Two times a day (BID) | ORAL | Status: DC
  Administered 2019-12-16 – 2019-12-18 (×4): 0.5 mg via ORAL
  Filled 2019-12-16 (×4): qty 1

## 2019-12-16 MED ORDER — ROPIVACAINE HCL 5 MG/ML IJ SOLN
INTRAMUSCULAR | Status: DC | PRN
Start: 2019-12-16 — End: 2019-12-16
  Administered 2019-12-16: 30 mL via PERINEURAL

## 2019-12-16 MED ORDER — DOCUSATE SODIUM 100 MG PO CAPS
100.0000 mg | ORAL_CAPSULE | Freq: Two times a day (BID) | ORAL | Status: DC
  Administered 2019-12-16 – 2019-12-18 (×4): 100 mg via ORAL
  Filled 2019-12-16 (×4): qty 1

## 2019-12-16 MED ORDER — HYDROMORPHONE HCL 1 MG/ML IJ SOLN
0.5000 mg | INTRAMUSCULAR | Status: DC | PRN
  Administered 2019-12-17 – 2019-12-18 (×3): 1 mg via INTRAVENOUS
  Filled 2019-12-16 (×3): qty 1

## 2019-12-16 MED ORDER — DIPHENHYDRAMINE HCL 12.5 MG/5ML PO ELIX: 12.5000 mg | ORAL_SOLUTION | ORAL | Status: DC | PRN

## 2019-12-16 MED ORDER — ASPIRIN 81 MG PO CHEW
81.0000 mg | CHEWABLE_TABLET | Freq: Two times a day (BID) | ORAL | Status: DC
  Administered 2019-12-16 – 2019-12-18 (×4): 81 mg via ORAL
  Filled 2019-12-16 (×4): qty 1

## 2019-12-16 MED ORDER — POLYETHYLENE GLYCOL 3350 17 G PO PACK: 17.0000 g | PACK | Freq: Every day | ORAL | Status: DC | PRN

## 2019-12-16 MED ORDER — SODIUM CHLORIDE 0.9 % IR SOLN
Status: DC | PRN
  Administered 2019-12-16: 3000 mL

## 2019-12-16 MED ORDER — OXYCODONE HCL 5 MG/5ML PO SOLN: 5.0000 mg | Freq: Once | ORAL | Status: DC | PRN

## 2019-12-16 MED ORDER — MENTHOL 3 MG MT LOZG: 1.0000 | LOZENGE | OROMUCOSAL | Status: DC | PRN

## 2019-12-16 SURGICAL SUPPLY — 71 items
BANDAGE ESMARK 6X9 LF (GAUZE/BANDAGES/DRESSINGS) ×1 IMPLANT
BEARIN INSERT TIBIAL SZ 3 11 (Insert) ×2 IMPLANT
BEARING INSERT TIBIAL SZ 3 11 (Insert) IMPLANT
BLADE SAG 18X100X1.27 (BLADE) ×2 IMPLANT
BNDG CMPR 9X6 STRL LF SNTH (GAUZE/BANDAGES/DRESSINGS) ×1
BNDG ELASTIC 6X5.8 VLCR STR LF (GAUZE/BANDAGES/DRESSINGS) ×2 IMPLANT
BNDG ESMARK 6X9 LF (GAUZE/BANDAGES/DRESSINGS) ×2
BOWL SMART MIX CTS (DISPOSABLE) ×2 IMPLANT
BSPLAT TIB 3 KN TRITANIUM (Knees) ×1 IMPLANT
COVER SURGICAL LIGHT HANDLE (MISCELLANEOUS) ×2 IMPLANT
COVER WAND RF STERILE (DRAPES) ×2 IMPLANT
CUFF TOURN SGL QUICK 34 (TOURNIQUET CUFF) ×2
CUFF TOURN SGL QUICK 42 (TOURNIQUET CUFF) IMPLANT
CUFF TRNQT CYL 34X4.125X (TOURNIQUET CUFF) ×1 IMPLANT
DRAPE EXTREMITY T 121X128X90 (DISPOSABLE) ×2 IMPLANT
DRAPE HALF SHEET 40X57 (DRAPES) ×2 IMPLANT
DRAPE U-SHAPE 47X51 STRL (DRAPES) ×2 IMPLANT
DRSG AQUACEL AG ADV 3.5X10 (GAUZE/BANDAGES/DRESSINGS) ×1 IMPLANT
DRSG PAD ABDOMINAL 8X10 ST (GAUZE/BANDAGES/DRESSINGS) ×2 IMPLANT
DRSG XEROFORM 1X8 (GAUZE/BANDAGES/DRESSINGS) ×2 IMPLANT
DURAPREP 26ML APPLICATOR (WOUND CARE) ×2 IMPLANT
ELECT CAUTERY BLADE 6.4 (BLADE) ×2 IMPLANT
ELECT REM PT RETURN 9FT ADLT (ELECTROSURGICAL) ×2
ELECTRODE REM PT RTRN 9FT ADLT (ELECTROSURGICAL) ×1 IMPLANT
FACESHIELD WRAPAROUND (MASK) ×6 IMPLANT
FEMORAL POSTERIOR SZ3 LT KNEE (Orthopedic Implant) IMPLANT
GAUZE SPONGE 4X4 12PLY STRL (GAUZE/BANDAGES/DRESSINGS) ×2 IMPLANT
GAUZE SPONGE 4X4 12PLY STRL LF (GAUZE/BANDAGES/DRESSINGS) ×1 IMPLANT
GAUZE XEROFORM 1X8 LF (GAUZE/BANDAGES/DRESSINGS) ×2 IMPLANT
GLOVE BIO SURGEON STRL SZ7.5 (GLOVE) ×2 IMPLANT
GLOVE BIOGEL PI IND STRL 8 (GLOVE) ×2 IMPLANT
GLOVE BIOGEL PI INDICATOR 8 (GLOVE) ×2
GLOVE ORTHO TXT STRL SZ7.5 (GLOVE) ×2 IMPLANT
GLOVE SURG ORTHO 8.0 STRL STRW (GLOVE) ×5 IMPLANT
GLOVE SURG SS PI 6.5 STRL IVOR (GLOVE) ×2 IMPLANT
GOWN STRL REUS W/ TWL LRG LVL3 (GOWN DISPOSABLE) IMPLANT
GOWN STRL REUS W/ TWL XL LVL3 (GOWN DISPOSABLE) ×2 IMPLANT
GOWN STRL REUS W/TWL LRG LVL3 (GOWN DISPOSABLE)
GOWN STRL REUS W/TWL XL LVL3 (GOWN DISPOSABLE) ×4
HANDPIECE INTERPULSE COAX TIP (DISPOSABLE) ×2
IMMOBILIZER KNEE 22 UNIV (SOFTGOODS) ×2 IMPLANT
KIT BASIN OR (CUSTOM PROCEDURE TRAY) ×2 IMPLANT
KIT TURNOVER KIT B (KITS) ×2 IMPLANT
KNEE PATELLA ASYMMETRIC 9X29 (Knees) ×1 IMPLANT
KNEE TIBIAL COMPONENT SZ3 (Knees) ×1 IMPLANT
MANIFOLD NEPTUNE II (INSTRUMENTS) ×2 IMPLANT
NEEDLE 18GX1X1/2 (RX/OR ONLY) (NEEDLE) IMPLANT
NS IRRIG 1000ML POUR BTL (IV SOLUTION) ×2 IMPLANT
PACK TOTAL JOINT (CUSTOM PROCEDURE TRAY) ×2 IMPLANT
PAD ABD 8X10 STRL (GAUZE/BANDAGES/DRESSINGS) ×1 IMPLANT
PAD ARMBOARD 7.5X6 YLW CONV (MISCELLANEOUS) ×2 IMPLANT
PADDING CAST COTTON 6X4 STRL (CAST SUPPLIES) ×2 IMPLANT
POSTERIOR FEMORAL SZ3 LT KNEE (Orthopedic Implant) ×2 IMPLANT
SET HNDPC FAN SPRY TIP SCT (DISPOSABLE) ×1 IMPLANT
SET PAD KNEE POSITIONER (MISCELLANEOUS) ×3 IMPLANT
STAPLER VISISTAT 35W (STAPLE) ×2 IMPLANT
STRIP CLOSURE SKIN 1/2X4 (GAUZE/BANDAGES/DRESSINGS) IMPLANT
SUCTION FRAZIER HANDLE 10FR (MISCELLANEOUS) ×2
SUCTION TUBE FRAZIER 10FR DISP (MISCELLANEOUS) ×1 IMPLANT
SUT MNCRL AB 4-0 PS2 18 (SUTURE) IMPLANT
SUT VIC AB 0 CT1 27 (SUTURE) ×2
SUT VIC AB 0 CT1 27XBRD ANBCTR (SUTURE) ×1 IMPLANT
SUT VIC AB 1 CT1 27 (SUTURE) ×4
SUT VIC AB 1 CT1 27XBRD ANBCTR (SUTURE) ×2 IMPLANT
SUT VIC AB 2-0 CT1 27 (SUTURE) ×4
SUT VIC AB 2-0 CT1 TAPERPNT 27 (SUTURE) ×2 IMPLANT
SYR 50ML LL SCALE MARK (SYRINGE) IMPLANT
TOWEL GREEN STERILE (TOWEL DISPOSABLE) ×2 IMPLANT
TOWEL GREEN STERILE FF (TOWEL DISPOSABLE) ×2 IMPLANT
TRAY CATH 16FR W/PLASTIC CATH (SET/KITS/TRAYS/PACK) IMPLANT
WRAP KNEE MAXI GEL POST OP (GAUZE/BANDAGES/DRESSINGS) ×2 IMPLANT

## 2019-12-16 NOTE — Anesthesia Procedure Notes (Signed)
Anesthesia Regional Block: Adductor canal block   Pre-Anesthetic Checklist: ,, timeout performed, Correct Patient, Correct Site, Correct Laterality, Correct Procedure, Correct Position, site marked, Risks and benefits discussed,  Surgical consent,  Pre-op evaluation,  At surgeon's request and post-op pain management  Laterality: Left  Prep: Maximum Sterile Barrier Precautions used, chloraprep       Needles:  Injection technique: Single-shot  Needle Type: Echogenic Stimulator Needle     Needle Length: 9cm  Needle Gauge: 22     Additional Needles:   Procedures:,,,, ultrasound used (permanent image in chart),,,,  Narrative:  Start time: 12/16/2019 11:32 AM End time: 12/16/2019 11:37 AM Injection made incrementally with aspirations every 5 mL.  Performed by: Personally  Anesthesiologist: Lannie Fields, DO  Additional Notes: Monitors applied. No increased pain on injection. No increased resistance to injection. Injection made in 5cc increments. Good needle visualization. Patient tolerated procedure well.

## 2019-12-16 NOTE — Anesthesia Postprocedure Evaluation (Signed)
Anesthesia Post Note  Patient: [name]  Procedure(s) Performed: LEFT TOTAL KNEE ARTHROPLASTY (Left Knee)     Patient location during evaluation: PACU Anesthesia Type: General and Regional Level of consciousness: awake and alert, oriented and patient cooperative Pain management: pain level controlled Vital Signs Assessment: post-procedure vital signs reviewed and stable Respiratory status: spontaneous breathing, nonlabored ventilation and respiratory function stable Cardiovascular status: blood pressure returned to baseline and stable Postop Assessment: no apparent nausea or vomiting Anesthetic complications: no   No complications documented.  Last Vitals:  Vitals:   12/16/19 1530 12/16/19 1545  BP: (!) 154/84   Pulse: 92 88  Resp: 16 12  Temp:    SpO2: 100% 100%    Last Pain:  Vitals:   12/16/19 1545  TempSrc:   PainSc: Asleep                 Lannie Fields

## 2019-12-16 NOTE — Brief Op Note (Signed)
12/16/2019  1:34 PM  PATIENT:  [name]  69 y.o. female  PRE-OPERATIVE DIAGNOSIS:  osteoarthritis left knee  POST-OPERATIVE DIAGNOSIS:  osteoarthritis left knee  PROCEDURE:  Procedure(s): LEFT TOTAL KNEE ARTHROPLASTY (Left)  SURGEON:  Surgeon(s) and Role:    Kathryne Hitch, MD - Primary  PHYSICIAN ASSISTANT:  Rexene Edison, PA-C  ANESTHESIA:   regional and general  EBL:  10 mL   TOURNIQUET:   Total Tourniquet Time Documented: Thigh (Left) - 43 minutes Total: Thigh (Left) - 43 minutes   DICTATION: .Other Dictation: Dictation Number [id number]  PLAN OF CARE: Admit for overnight observation  PATIENT DISPOSITION:  PACU - hemodynamically stable.   Delay start of Pharmacological VTE agent (>24hrs) due to surgical blood loss or risk of bleeding: no

## 2019-12-16 NOTE — H&P (Signed)
TOTAL KNEE ADMISSION H&P  Patient is being admitted for left total knee arthroplasty.  Subjective:  Chief Complaint:left knee pain.  HPI: [name], 69 y.o. female, has a history of pain and functional disability in the left knee due to arthritis and has failed non-surgical conservative treatments for greater than 12 weeks to includeNSAID's and/or analgesics, corticosteriod injections, viscosupplementation injections, flexibility and strengthening excercises, use of assistive devices and activity modification.  Onset of symptoms was gradual, starting 3 years ago with gradually worsening course since that time. The patient noted no past surgery on the left knee(s).  Patient currently rates pain in the left knee(s) at 10 out of 10 with activity. Patient has night pain, worsening of pain with activity and weight bearing, pain that interferes with activities of daily living, pain with passive range of motion, crepitus and joint swelling.  Patient has evidence of subchondral sclerosis, periarticular osteophytes and joint space narrowing by imaging studies. There is no active infection.  Patient Active Problem List   Diagnosis Date Noted   Unilateral primary osteoarthritis, left knee 10/29/2019   Unilateral primary osteoarthritis, right knee 10/29/2019   Past Medical History:  Diagnosis Date   Anxiety    Arthritis    COPD (chronic obstructive pulmonary disease) (HCC)    Depression    GERD (gastroesophageal reflux disease)    History of hiatal hernia    RLS (restless legs syndrome)     Past Surgical History:  Procedure Laterality Date   BREAST SURGERY     lumpectomy   CHOLECYSTECTOMY     TONSILLECTOMY      Current Facility-Administered Medications  Medication Dose Route Frequency Provider Last Rate Last Admin   ceFAZolin (ANCEF) IVPB 2g/100 mL premix  2 g Intravenous On Call to OR Kirtland Bouchard, PA-C       povidone-iodine 10 % swab 2 application  2 application  Topical Once Richardean Canal W, PA-C       tranexamic acid (CYKLOKAPRON) IVPB 1,000 mg  1,000 mg Intravenous To OR Kirtland Bouchard, PA-C       Facility-Administered Medications Ordered in Other Encounters  Medication Dose Route Frequency Provider Last Rate Last Admin   fentaNYL citrate (PF) (SUBLIMAZE) injection   Intravenous Anesthesia Intra-op Muqtasid, Samantha T, CRNA   50 mcg at 12/16/19 1134   midazolam (VERSED) injection   Intravenous Anesthesia Intra-op Muqtasid, Samantha T, CRNA   1 mg at 12/16/19 1135   No Known Allergies  Social History   Tobacco Use   Smoking status: Current Every Day Smoker    Packs/day: 0.50    Years: 40.00    Pack years: 20.00   Smokeless tobacco: Never Used  Substance Use Topics   Alcohol use: Never    History reviewed. No pertinent family history.   Review of Systems  Musculoskeletal: Positive for joint swelling.  All other systems reviewed and are negative.   Objective:  Physical Exam Vitals reviewed.  Constitutional:      Appearance: Normal appearance.  HENT:     Head: Normocephalic and atraumatic.  Eyes:     Pupils: Pupils are equal, round, and reactive to light.  Cardiovascular:     Rate and Rhythm: Normal rate.     Pulses: Normal pulses.  Pulmonary:     Effort: Pulmonary effort is normal.  Abdominal:     Palpations: Abdomen is soft.  Musculoskeletal:     Cervical back: Normal range of motion.     Left knee: Swelling, effusion  and bony tenderness present. Decreased range of motion. Tenderness present over the medial joint line, lateral joint line and patellar tendon. Abnormal alignment.  Neurological:     Mental Status: She is alert and oriented to person, place, and time.  Psychiatric:        Behavior: Behavior normal.     Vital signs in last 24 hours: Temp:  [97.7 F (36.5 C)] 97.7 F (36.5 C) (08/10 1013) Pulse Rate:  [84] 84 (08/10 1013) Resp:  [18] 18 (08/10 1013) BP: (146)/(78) 146/78 (08/10 1013) SpO2:   [96 %] 96 % (08/10 1013) Weight:  [92.5 kg] 92.5 kg (08/10 1013)  Labs:   Estimated body mass index is 32.93 kg/m as calculated from the following:   Height as of this encounter: 5\' 6"  (1.676 m).   Weight as of this encounter: 92.5 kg.   Imaging Review Plain radiographs demonstrate severe degenerative joint disease of the left knee(s). The overall alignment ismild varus. The bone quality appears to be good for age and reported activity level.      Assessment/Plan:  End stage arthritis, left knee   The patient history, physical examination, clinical judgment of the provider and imaging studies are consistent with end stage degenerative joint disease of the left knee(s) and total knee arthroplasty is deemed medically necessary. The treatment options including medical management, injection therapy arthroscopy and arthroplasty were discussed at length. The risks and benefits of total knee arthroplasty were presented and reviewed. The risks due to aseptic loosening, infection, stiffness, patella tracking problems, thromboembolic complications and other imponderables were discussed. The patient acknowledged the explanation, agreed to proceed with the plan and consent was signed. Patient is being admitted for inpatient treatment for surgery, pain control, PT, OT, prophylactic antibiotics, VTE prophylaxis, progressive ambulation and ADL's and discharge planning. The patient is planning to be discharged home with home health services

## 2019-12-16 NOTE — Transfer of Care (Signed)
Immediate Anesthesia Transfer of Care Note  Patient: [name]  Procedure(s) Performed: LEFT TOTAL KNEE ARTHROPLASTY (Left Knee)  Patient Location: PACU  Anesthesia Type:General and GA combined with regional for post-op pain  Level of Consciousness: drowsy and patient cooperative  Airway & Oxygen Therapy: Patient Spontanous Breathing  Post-op Assessment: Report given to RN, Post -op Vital signs reviewed and stable and Patient moving all extremities X 4  Post vital signs: Reviewed and stable  Last Vitals:  Vitals Value Taken Time  BP    Temp    Pulse    Resp    SpO2      Last Pain:  Vitals:   12/16/19 1021  TempSrc:   PainSc: 8       Patients Stated Pain Goal: 3 (12/16/19 1021)  Complications: No complications documented.

## 2019-12-16 NOTE — Anesthesia Procedure Notes (Signed)
Procedure Name: Intubation Date/Time: 12/16/2019 12:31 PM Performed by: Darletta Moll, CRNA Pre-anesthesia Checklist: Patient identified, Emergency Drugs available, Suction available and Patient being monitored Patient Re-evaluated:Patient Re-evaluated prior to induction Oxygen Delivery Method: Circle system utilized Preoxygenation: Pre-oxygenation with 100% oxygen Induction Type: IV induction Ventilation: Mask ventilation without difficulty Laryngoscope Size: Mac and 3 Grade View: Grade I Tube type: Oral Tube size: 7.5 mm Number of attempts: 1 Airway Equipment and Method: Stylet and Oral airway Placement Confirmation: positive ETCO2,  breath sounds checked- equal and bilateral and CO2 detector Secured at: 21 cm Tube secured with: Tape Dental Injury: Teeth and Oropharynx as per pre-operative assessment

## 2019-12-16 NOTE — Progress Notes (Signed)
Orthopedic Tech Progress Note Patient Details:  Christelle Igoe 1951-03-19 [id number] Applied CPM while patient was in PACU. Had RN lift leg and she was already in pain so I knew she wasn't going to reach 60 degrees  CPM Left Knee CPM Left Knee: On Left Knee Flexion (Degrees): 35 Left Knee Extension (Degrees): 0 Additional Comments: ADDED ICE  Post Interventions Patient Tolerated: Fair Instructions Provided: Care of device  Donald Pore 12/16/2019, 4:58 PM

## 2019-12-16 NOTE — Evaluation (Signed)
Physical Therapy Evaluation Patient Details Name: [name] MRN: [id number] DOB: 1951/01/04 Today's Date: 12/16/2019   History of Present Illness  Pt is a 69 y/o female s/p L TKA. PMH includes COPD.   Clinical Impression  Pt is s/p surgery above with deficits below. Pt requiring min guard A to ambulate to chair using RW. Distance limited secondary to pain. Reviewed knee precautions with pt. Will continue to follow acutely to maximize functional mobility independence and safety.     Follow Up Recommendations Follow surgeon's recommendation for DC plan and follow-up therapies    Equipment Recommendations  None recommended by PT    Recommendations for Other Services       Precautions / Restrictions Precautions Precautions: Knee Precaution Booklet Issued: No Precaution Comments: Verbally reviewed knee precautions  Restrictions Weight Bearing Restrictions: Yes LLE Weight Bearing: Weight bearing as tolerated      Mobility  Bed Mobility Overal bed mobility: Needs Assistance Bed Mobility: Supine to Sit     Supine to sit: Supervision     General bed mobility comments: Supervision for safety to transfer to EOB.   Transfers Overall transfer level: Needs assistance Equipment used: Rolling walker (2 wheeled) Transfers: Sit to/from Stand Sit to Stand: Min guard         General transfer comment: Min guard for safety. Cues for safe hand placement.   Ambulation/Gait Ambulation/Gait assistance: Min guard Gait Distance (Feet): 5 Feet Assistive device: Rolling walker (2 wheeled) Gait Pattern/deviations: Step-to pattern;Decreased step length - right;Decreased step length - left;Decreased weight shift to left;Antalgic Gait velocity: Decreased    General Gait Details: Slow, antalgic gait. Distance limited to chair secondary to pain. Cues for sequencing using RW.   Stairs            Wheelchair Mobility    Modified Rankin (Stroke Patients Only)       Balance  Overall balance assessment: Needs assistance Sitting-balance support: No upper extremity supported;Feet supported Sitting balance-Leahy Scale: Fair     Standing balance support: Bilateral upper extremity supported;During functional activity Standing balance-Leahy Scale: Poor Standing balance comment: Reliant on BUE support                              Pertinent Vitals/Pain Pain Assessment: Faces Faces Pain Scale: Hurts even more Pain Location: L knee  Pain Descriptors / Indicators: Aching;Operative site guarding Pain Intervention(s): Limited activity within patient's tolerance;Monitored during session;Repositioned    Home Living Family/patient expects to be discharged to:: Private residence Living Arrangements: Alone Available Help at Discharge: Family Type of Home: Apartment Home Access: Level entry     Home Layout: One level Home Equipment: Cane - single point;Walker - 2 wheels;Bedside commode      Prior Function Level of Independence: Independent with assistive device(s)         Comments: Used cane prior to admittion      Hand Dominance        Extremity/Trunk Assessment   Upper Extremity Assessment Upper Extremity Assessment: Overall WFL for tasks assessed    Lower Extremity Assessment Lower Extremity Assessment: LLE deficits/detail LLE Deficits / Details: Deficits consistent with post op pain and weakness.     Cervical / Trunk Assessment Cervical / Trunk Assessment: Normal  Communication   Communication: No difficulties  Cognition Arousal/Alertness: Awake/alert Behavior During Therapy: WFL for tasks assessed/performed Overall Cognitive Status: Within Functional Limits for tasks assessed  General Comments General comments (skin integrity, edema, etc.): Pt's daughter present during session     Exercises Total Joint Exercises Ankle Circles/Pumps: AROM;Both;10 reps   Assessment/Plan     PT Assessment Patient needs continued PT services  PT Problem List Decreased strength;Decreased balance;Decreased mobility;Decreased activity tolerance;Decreased knowledge of use of DME;Decreased knowledge of precautions;Pain       PT Treatment Interventions DME instruction;Gait training;Functional mobility training;Therapeutic activities;Therapeutic exercise;Balance training;Patient/family education    PT Goals (Current goals can be found in the Care Plan section)  Acute Rehab PT Goals Patient Stated Goal: to go home PT Goal Formulation: With patient Time For Goal Achievement: 12/30/19 Potential to Achieve Goals: Good    Frequency 7X/week   Barriers to discharge        Co-evaluation               AM-PAC PT "6 Clicks" Mobility  Outcome Measure Help needed turning from your back to your side while in a flat bed without using bedrails?: None Help needed moving from lying on your back to sitting on the side of a flat bed without using bedrails?: None Help needed moving to and from a bed to a chair (including a wheelchair)?: A Little Help needed standing up from a chair using your arms (e.g., wheelchair or bedside chair)?: A Little Help needed to walk in hospital room?: A Little Help needed climbing 3-5 steps with a railing? : A Lot 6 Click Score: 19    End of Session Equipment Utilized During Treatment: Gait belt Activity Tolerance: Patient limited by pain Patient left: in chair;with call bell/phone within reach;with family/visitor present Nurse Communication: Mobility status PT Visit Diagnosis: Other abnormalities of gait and mobility (R26.89);Pain Pain - Right/Left: Left Pain - part of body: Knee    Time: 1941-7408 PT Time Calculation (min) (ACUTE ONLY): 20 min   Charges:   PT Evaluation $PT Eval Low Complexity: 1 Low          Cindee Salt, DPT  Acute Rehabilitation Services  Pager: [phone] Office: [phone]   Lehman Prom 12/16/2019, 6:09 PM

## 2019-12-16 NOTE — Plan of Care (Signed)

## 2019-12-17 ENCOUNTER — Encounter (HOSPITAL_COMMUNITY): Payer: Self-pay | Admitting: Orthopaedic Surgery

## 2019-12-17 DIAGNOSIS — G2581 Restless legs syndrome: Secondary | ICD-10-CM | POA: Diagnosis present

## 2019-12-17 DIAGNOSIS — M1712 Unilateral primary osteoarthritis, left knee: Secondary | ICD-10-CM | POA: Diagnosis present

## 2019-12-17 DIAGNOSIS — J449 Chronic obstructive pulmonary disease, unspecified: Secondary | ICD-10-CM | POA: Diagnosis present

## 2019-12-17 DIAGNOSIS — F17210 Nicotine dependence, cigarettes, uncomplicated: Secondary | ICD-10-CM | POA: Diagnosis present

## 2019-12-17 DIAGNOSIS — Z6832 Body mass index (BMI) 32.0-32.9, adult: Secondary | ICD-10-CM | POA: Diagnosis not present

## 2019-12-17 DIAGNOSIS — F329 Major depressive disorder, single episode, unspecified: Secondary | ICD-10-CM | POA: Diagnosis present

## 2019-12-17 DIAGNOSIS — Z23 Encounter for immunization: Secondary | ICD-10-CM | POA: Diagnosis present

## 2019-12-17 DIAGNOSIS — E669 Obesity, unspecified: Secondary | ICD-10-CM | POA: Diagnosis present

## 2019-12-17 DIAGNOSIS — Z20822 Contact with and (suspected) exposure to covid-19: Secondary | ICD-10-CM | POA: Diagnosis present

## 2019-12-17 DIAGNOSIS — K219 Gastro-esophageal reflux disease without esophagitis: Secondary | ICD-10-CM | POA: Diagnosis present

## 2019-12-17 LAB — CBC
HCT: 33.1 % — ABNORMAL LOW (ref 36.0–46.0)
Hemoglobin: 11.1 g/dL — ABNORMAL LOW (ref 12.0–15.0)
MCH: 30.9 pg (ref 26.0–34.0)
MCHC: 33.5 g/dL (ref 30.0–36.0)
MCV: 92.2 fL (ref 80.0–100.0)
Platelets: UNDETERMINED 10*3/uL (ref 150–400)
RBC: 3.59 MIL/uL — ABNORMAL LOW (ref 3.87–5.11)
RDW: 12.1 % (ref 11.5–15.5)
WBC: 12 10*3/uL — ABNORMAL HIGH (ref 4.0–10.5)
nRBC: 0 % (ref 0.0–0.2)

## 2019-12-17 LAB — BASIC METABOLIC PANEL
Anion gap: 9 (ref 5–15)
BUN: 16 mg/dL (ref 8–23)
CO2: 27 mmol/L (ref 22–32)
Calcium: 9 mg/dL (ref 8.9–10.3)
Chloride: 105 mmol/L (ref 98–111)
Creatinine, Ser: 1.19 mg/dL — ABNORMAL HIGH (ref 0.44–1.00)
GFR calc Af Amer: 54 mL/min — ABNORMAL LOW (ref >60)
GFR calc non Af Amer: 47 mL/min — ABNORMAL LOW (ref >60)
Glucose, Bld: 150 mg/dL — ABNORMAL HIGH (ref 70–99)
Potassium: 4 mmol/L (ref 3.5–5.1)
Sodium: 141 mmol/L (ref 135–145)

## 2019-12-17 MED ORDER — OXYCODONE HCL 5 MG PO TABS: 5.0000 mg | ORAL_TABLET | ORAL | 0 refills | Status: DC | PRN

## 2019-12-17 MED ORDER — ASPIRIN 81 MG PO CHEW: 81.0000 mg | CHEWABLE_TABLET | Freq: Two times a day (BID) | ORAL | 0 refills | Status: DC

## 2019-12-17 NOTE — Discharge Instructions (Signed)

## 2019-12-17 NOTE — Progress Notes (Signed)
Subjective: 1 Day Post-Op Procedure(s) (LRB): LEFT TOTAL KNEE ARTHROPLASTY (Left) Patient reports pain as moderate.  Currently in CPM.  Labs stable  Objective: Vital signs in last 24 hours: Temp:  [97.6 F (36.4 C)-99.6 F (37.6 C)] 98.3 F (36.8 C) (08/11 0349) Pulse Rate:  [68-100] 85 (08/11 0349) Resp:  [12-18] 17 (08/11 0349) BP: (118-157)/(61-98) 118/61 (08/11 0349) SpO2:  [93 %-100 %] 96 % (08/11 0349) Weight:  [92.5 kg] 92.5 kg (08/10 1013)  Intake/Output from previous day: 08/10 0701 - 08/11 0700 In: 2249.5 [P.O.:680; I.V.:1269.4; IV Piggyback:300] Out: 500 [Urine:300; Blood:200] Intake/Output this shift: No intake/output data recorded.  Recent Labs    12/17/19 0341  HGB 11.1*   Recent Labs    12/17/19 0341  WBC 12.0*  RBC 3.59*  HCT 33.1*  PLT PLATELET CLUMPS NOTED ON SMEAR, UNABLE TO ESTIMATE   Recent Labs    12/17/19 0341  NA 141  K 4.0  CL 105  CO2 27  BUN 16  CREATININE 1.19*  GLUCOSE 150*  CALCIUM 9.0   No results for input(s): LABPT, INR in the last 72 hours.  Sensation intact distally Intact pulses distally Dorsiflexion/Plantar flexion intact Incision: dressing C/D/I Compartment soft   Assessment/Plan: 1 Day Post-Op Procedure(s) (LRB): LEFT TOTAL KNEE ARTHROPLASTY (Left) Up with therapy Discharge home with home health Discharge really depend on how the patient does today.  She has not been up with therapy as of yet.  I will be by later today to change her dressing to a small Aquacel dressing and see how she is doing from a mobility standpoint and a pain control standpoint which will then help determine when she can be discharged.     Kathryne Hitch 12/17/2019, 8:07 AM

## 2019-12-17 NOTE — Progress Notes (Signed)
Physical Therapy Treatment Patient Details Name: [name] MRN: [id number] DOB: [birth year] Today's Date: 12/17/2019    History of Present Illness Pt is a 69 y/o female s/p L TKA. PMH includes COPD.     PT Comments    Pt is currently progressing towards goals. Pt required supervision to min guard assist with all mobility tasks. Pt was limited in today's session secondary to pain. Pt was educated on HEP. Knee ROM flexion: 68, extension: 11. Current recommendation remain appropriate for d/c plan. Pt would continue to benefit from further acute therapy in order to  return her to PLOF. Will continue to follow acutely.    Follow Up Recommendations  Follow surgeon's recommendation for DC plan and follow-up therapies     Equipment Recommendations  None recommended by PT    Recommendations for Other Services       Precautions / Restrictions Precautions Precautions: Knee Precaution Booklet Issued: No Precaution Comments: Verbally reviewed knee precautions  Restrictions Weight Bearing Restrictions: Yes LLE Weight Bearing: Weight bearing as tolerated    Mobility  Bed Mobility Overal bed mobility: Needs Assistance Bed Mobility: Supine to Sit     Supine to sit: Supervision     General bed mobility comments: Supervision for safety to transfer to EOB.   Transfers Overall transfer level: Needs assistance Equipment used: Rolling walker (2 wheeled) Transfers: Sit to/from Stand Sit to Stand: Min guard         General transfer comment: Min guard for safety. Cues for safe hand placement on RW and EOB for lift off  Ambulation/Gait Ambulation/Gait assistance: Min guard Gait Distance (Feet): 75 Feet Assistive device: Rolling walker (2 wheeled) Gait Pattern/deviations: Step-through pattern;Decreased stance time - left;Decreased step length - left;Decreased step length - right;Decreased stride length;Antalgic;Trunk flexed Gait velocity: Decreased    General Gait Details: pt  had a slow, antalgic gait. Pt required cues for RW sequencing, proximity to device and for upright posture in ambulation. Pt had increase in stride length as session progresssed and was noted to have more natural gait pattern   Stairs             Wheelchair Mobility    Modified Rankin (Stroke Patients Only)       Balance Overall balance assessment: Needs assistance Sitting-balance support: No upper extremity supported;Feet supported Sitting balance-Leahy Scale: Fair     Standing balance support: Bilateral upper extremity supported;During functional activity Standing balance-Leahy Scale: Poor Standing balance comment: pt was reliant on BUE support during standing                            Cognition Arousal/Alertness: Awake/alert Behavior During Therapy: WFL for tasks assessed/performed Overall Cognitive Status: Within Functional Limits for tasks assessed                                        Exercises Total Joint Exercises Ankle Circles/Pumps: AROM;Both;10 reps Quad Sets: AROM;Left;5 reps Short Arc Quad: AROM;5 reps;Left;Supine Heel Slides: Left;10 reps;AAROM Hip ABduction/ADduction: Left;10 reps;AAROM Straight Leg Raises: AROM;5 reps;Left Goniometric ROM: Extension: 11, Flexion: 68    General Comments General comments (skin integrity, edema, etc.): pt was educated on HEP      Pertinent Vitals/Pain Pain Assessment: 0-10 Pain Score: 10-Worst pain ever Pain Location: L knee  Pain Descriptors / Indicators: Aching;Operative site guarding Pain Intervention(s): Limited activity within patient's  tolerance;Monitored during session;Ice applied;Premedicated before session    Home Living                      Prior Function            PT Goals (current goals can now be found in the care plan section) Acute Rehab PT Goals Patient Stated Goal: to go home PT Goal Formulation: With patient Time For Goal Achievement:  12/30/19 Potential to Achieve Goals: Good Progress towards PT goals: Progressing toward goals    Frequency    7X/week      PT Plan Current plan remains appropriate    Co-evaluation              AM-PAC PT "6 Clicks" Mobility   Outcome Measure  Help needed turning from your back to your side while in a flat bed without using bedrails?: None Help needed moving from lying on your back to sitting on the side of a flat bed without using bedrails?: None Help needed moving to and from a bed to a chair (including a wheelchair)?: A Little Help needed standing up from a chair using your arms (e.g., wheelchair or bedside chair)?: A Little Help needed to walk in hospital room?: A Little Help needed climbing 3-5 steps with a railing? : A Lot 6 Click Score: 19    End of Session Equipment Utilized During Treatment: Gait belt Activity Tolerance: Patient limited by pain Patient left: in chair;with call Bonetta Mostek/phone within reach;with family/visitor present Nurse Communication: Mobility status PT Visit Diagnosis: Other abnormalities of gait and mobility (R26.89);Pain Pain - Right/Left: Left Pain - part of body: Knee     Time: 6945-0388 PT Time Calculation (min) (ACUTE ONLY): 32 min  Charges:  $Gait Training: 8-22 mins $Therapeutic Exercise: 8-22 mins                     Harmon Pier, SPT  Acute Rehabilitation Services  Office: [phone]  12/17/2019, 10:46 AM

## 2019-12-17 NOTE — Plan of Care (Signed)

## 2019-12-17 NOTE — Progress Notes (Signed)
Patient ID: [name], female   DOB: 1950/08/22, 69 y.o.   MRN: [id number] I change the patient's left knee dressing at the bedside.  We need to change her to an inpatient admission because she does need to stay until tomorrow to maximize the benefits of therapy due to her being a fall risk and due to the fact that she will be transitioning to home with only some support.  We are working on pain control as well.  Discharge should still be to home tomorrow.

## 2019-12-17 NOTE — Op Note (Signed)
NAMECAYMAN, [name] MEDICAL RECORD [id number] ACCOUNT [id number] DATE OF BIRTH:04-Nov-1950 FACILITY: [facility] LOCATION: [facility] PHYSICIAN:Tolulope Pinkett Aretha Parrot, MD  OPERATIVE REPORT  DATE OF PROCEDURE:  12/16/2019  PREOPERATIVE DIAGNOSIS:  Primary osteoarthritis and degenerative joint disease, left knee.  POSTOPERATIVE DIAGNOSIS:  Primary osteoarthritis and degenerative joint disease, left knee.  PROCEDURE:  Left total knee arthroplasty.  IMPLANTS:  [employer] Triathlon press-fit knee system with size 3 femur, size 3 tibial tray, 11 mm fixed bearing polyethylene insert, size 29 patellar button.  SURGEON:  Vanita Panda. Magnus Ivan, MD  ASSISTANT:  Richardean Canal, PA-C  ANESTHESIA:   1.  Left lower extremity adductor canal block. 2.  General.  TOURNIQUET TIME:  Less than 1 hour.  ANTIBIOTICS:  Two grams IV Ancef.  ESTIMATED BLOOD LOSS:  100 mL.  COMPLICATIONS:  None.  INDICATIONS:  The patient is a 69 year old female well known to me.  She has debilitating arthritis involving both of her knees.  Her left knee hurts worse than her right knee.  At this point, she has tried and failed all forms of conservative treatment  and does wish to proceed with a total knee arthroplasty and we have recommended this to her as well.  We have talked about the risk of the surgery being the risk of acute blood loss anemia, nerve or vessel injury, fracture, infection, DVT, and implant  failure.  We talked about the goals being decreased pain, improve mobility, and overall improve quality of life.  DESCRIPTION OF PROCEDURE:  After informed consent was obtained and appropriate left knee was marked an adductor canal block was obtained of her left lower extremity in the holding room.  She was then brought to the operating room and placed supine on the  operating table.  General anesthesia was then obtained.  A nonsterile tourniquet was placed around her upper left thigh and her left thigh, knee, leg,  and ankle were prepped and draped with DuraPrep and sterile drapes including a sterile stockinette.   Time-out was called to identify correct patient, correct left knee.  We then used an Esmarch to wrap the leg and tourniquet was inflated to 300 mm of pressure.  I then made a direct midline incision over the patella and carried this proximally and  distally.  We dissected down the knee joint and carried out a medial parapatellar arthrotomy finding a moderate joint effusion and significant periarticular osteophytes in all 3 compartments of the knee.  We also found complete cartilage loss throughout  the medial compartment of the knee.  There were also some loose bodies.  We removed the loose bodies as well as all the osteophytes around the knee.  We removed the ACL, PCL, medial and lateral meniscus.  With an extramedullary cutting guide for making  our tibia cut we set our proximal tibia cut for taking 9 mm off the high side, correcting varus and valgus and a neutral slope.  We made this cut without difficulty.  We then used the intramedullary guide for our entry into the femur for our distal  femoral cutting guide.  This was set for a left knee at 5 degrees externally rotated for an 8 mm distal femoral cut.  We made that 8 mm cut without difficulty and brought the knee back down to full extension.  We removed more remnants of the medial and  lateral meniscus from the back of the knee and then placed a 9 mm extension block and we had achieved full extension.  We then  back to the femur, put our femoral sizing guide based off the epicondylar axis and Whitesides line.  Based off this, we chose a  size 3 femur.  We put a 4-in-1 cutting block for a size 3 femur, made our anterior and posterior cuts, followed by our chamfer cuts.  We then made our femoral box cut.  Of note, we did find her to have significantly good hard bone.  We then turned  attention back to the tibia.  For the tibial tray we chose a size 3  setting our rotation off the tibial tubercle and the femur and this had good coverage.  She also had great bone in this area when we did our keel punch.  We did this with a press-fit  system because of her good quality bone.  She is also not on any osteoporosis medications.  Once we placed our size 3 trial tibia we trialed our 3 left femur and put a 9 and then 11 mm polyethylene liner and we were pleased with the stability of the 11  liner.  We then made our patellar cut and drilled 3 holes for a size 29 press-fit patellar button.  We then put the knee through several cycles of motion.  We were pleased with the motion and stability.  We removed all trial instrumentation from the knee  and then dried the knee real well.  We then irrigated with normal saline solution and dried it thoroughly again.  We then with the knee in a flexed position, placed our real press-fit size 3 tibia, followed by our size 3 press-fit left femur.  We placed  our real 11 mm fixed bearing polyethylene insert and press-fit our patellar button.  Again, we put her through several cycles of motion.  We were pleased with the motion and stability of the left knee.  We then let our tourniquet down.  Hemostasis  obtained with electrocautery.  We closed the arthrotomy with interrupted #1 Vicryl suture followed by closing the deep tissue with 0 Vicryl and the subcutaneous tissue closed with 2-0 Vicryl.  Staples were used to close the skin.  Xeroform well-padded  sterile dressing was applied.  She was awakened, extubated, and taken to recovery room in stable condition.  All final counts were correct.  There were no complications noted.  Of note, Rexene Edison, PA-C did assist during the entire case and his assistance  was crucial for facilitating all aspects of this case.  CN/NUANCE  D:12/16/2019 T:12/17/2019 JOB:[id number]

## 2019-12-17 NOTE — Progress Notes (Signed)
Physical Therapy Treatment Patient Details Name: [name] MRN: [id number] DOB: [birth year] Today's Date: 12/17/2019    History of Present Illness Pt is a 69 y/o female s/p L TKA. PMH includes COPD.     PT Comments    Pt supine in bed on arrival.  Pt reports feeling stiff.  Performed gt training and therapeutic exercises with good tolerance.  Plan for follow up PT in am.     Follow Up Recommendations  Follow surgeon's recommendation for DC plan and follow-up therapies     Equipment Recommendations  None recommended by PT    Recommendations for Other Services       Precautions / Restrictions Precautions Precautions: Knee Precaution Booklet Issued: No Precaution Comments: Verbally reviewed knee precautions  Restrictions Weight Bearing Restrictions: Yes LLE Weight Bearing: Weight bearing as tolerated    Mobility  Bed Mobility Overal bed mobility: Needs Assistance Bed Mobility: Supine to Sit     Supine to sit: Supervision     General bed mobility comments: Supervision for safety to transfer to EOB.   Transfers Overall transfer level: Needs assistance Equipment used: Rolling walker (2 wheeled) Transfers: Sit to/from Stand Sit to Stand: Min guard         General transfer comment: Min guard for safety. Cues for safe hand placement on RW and EOB for lift off  Ambulation/Gait Ambulation/Gait assistance: Min guard Gait Distance (Feet): 75 Feet Assistive device: Rolling walker (2 wheeled) Gait Pattern/deviations: Step-through pattern;Decreased stance time - left;Decreased step length - left;Decreased step length - right;Decreased stride length;Antalgic;Trunk flexed     General Gait Details: pt had a slow, antalgic gait. Pt required cues for RW sequencing, proximity to device and for upright posture in ambulation. Pt had increase in stride length as session progresssed and was noted to have more natural gait pattern.  Slight circumduction of L leg, cues for  increased knee flexion in swing phase.   Stairs             Wheelchair Mobility    Modified Rankin (Stroke Patients Only)       Balance Overall balance assessment: Needs assistance Sitting-balance support: No upper extremity supported;Feet supported Sitting balance-Leahy Scale: Fair       Standing balance-Leahy Scale: Poor Standing balance comment: pt was reliant on BUE support during standing                            Cognition Arousal/Alertness: Awake/alert Behavior During Therapy: WFL for tasks assessed/performed Overall Cognitive Status: Within Functional Limits for tasks assessed                                        Exercises Total Joint Exercises Ankle Circles/Pumps: AROM;Both;Supine;20 reps Quad Sets: AROM;Left;10 reps;Supine Heel Slides: AAROM;Left;10 reps;Supine Hip ABduction/ADduction: AAROM;Left;10 reps;Supine Straight Leg Raises: AAROM;Left;10 reps;Supine    General Comments        Pertinent Vitals/Pain Pain Assessment: 0-10 Pain Score: 6  Faces Pain Scale: Hurts even more Pain Location: L knee  Pain Descriptors / Indicators: Aching;Operative site guarding Pain Intervention(s): Monitored during session;Repositioned    Home Living                      Prior Function            PT Goals (current goals can now be found  in the care plan section) Acute Rehab PT Goals Patient Stated Goal: to go home Potential to Achieve Goals: Good Progress towards PT goals: Progressing toward goals    Frequency    7X/week      PT Plan Current plan remains appropriate    Co-evaluation              AM-PAC PT "6 Clicks" Mobility   Outcome Measure  Help needed turning from your back to your side while in a flat bed without using bedrails?: None Help needed moving from lying on your back to sitting on the side of a flat bed without using bedrails?: None Help needed moving to and from a bed to a chair  (including a wheelchair)?: A Little Help needed standing up from a chair using your arms (e.g., wheelchair or bedside chair)?: A Little Help needed to walk in hospital room?: A Little Help needed climbing 3-5 steps with a railing? : A Little 6 Click Score: 20    End of Session Equipment Utilized During Treatment: Gait belt Activity Tolerance: Patient limited by pain Patient left: in bed;with call bell/phone within reach;with bed alarm set Nurse Communication: Mobility status PT Visit Diagnosis: Other abnormalities of gait and mobility (R26.89);Pain Pain - Right/Left: Left Pain - part of body: Knee     Time: 5732-2025 PT Time Calculation (min) (ACUTE ONLY): 18 min  Charges:  $Gait Training: 8-22 mins                     Bonney Leitz , PTA Acute Rehabilitation Services Pager [phone] Office [phone]     Husain Costabile Artis Delay 12/17/2019, 4:06 PM

## 2019-12-18 ENCOUNTER — Telehealth: Payer: Self-pay | Admitting: Orthopaedic Surgery

## 2019-12-18 NOTE — TOC Transition Note (Addendum)
Transition of Care [facility]) - CM/SW Discharge Note   Patient Details  Name: [name] MRN: [id number] Date of Birth: 15-Oct-1950  Transition of Care [facility]) CM/SW Contact:  Epifanio Lesches, RN Phone Number: [phone] 12/18/2019, 9:48 AM   Clinical Narrative:    Patient will DC to: home  Anticipated DC date: 12/18/2019 Family notified: daughter, [employer] Transport by: daughter/ car       - Status post total left knee replacement, 8/10  Per MD patient ready for DC today . RN, patient, and  patient's family aware of DC plan. Pt without DME needs . States already has rolling walker, BSC. HHPT services will be provided by [facility] 8/12. Pt without Rx med concerns or affordability. RNCM will sign off for now as intervention is no longer needed. Please consult [place] again if new needs arise.  Ellwood Handler (Daughter) Pt's only #   [phone]      Final next level of care: Home w Home Health Services Barriers to Discharge: No Barriers Identified   Patient Goals and CMS Choice     Choice offered to / list presented to : Patient  Discharge Placement                       Discharge Plan and Services                DME Arranged: N/A DME Agency: NA       HH Arranged: PT HH Agency: [facility] Date [facility] Agency Contacted: 12/18/19 Time HH Agency Contacted: [phone] Representative spoke with at [facility] Agency: Elnita Maxwell  Social Determinants of Health (SDOH) Interventions     Readmission Risk Interventions No flowsheet data found.

## 2019-12-18 NOTE — Telephone Encounter (Signed)
Patient called.   Requesting that we call her [facility] pharmacy and get an issue filling the rx taken care of.   Call back: [phone]

## 2019-12-18 NOTE — Progress Notes (Signed)
Subjective: 2 Days Post-Op Procedure(s) (LRB): LEFT TOTAL KNEE ARTHROPLASTY (Left) Patient reports pain as moderate.    Objective: Vital signs in last 24 hours: Temp:  [98.2 F (36.8 C)-99 F (37.2 C)] 99 F (37.2 C) (08/12 0854) Pulse Rate:  [85-101] 98 (08/12 0854) Resp:  [15-18] 17 (08/12 0854) BP: (120-166)/(65-81) 120/81 (08/12 0854) SpO2:  [94 %-97 %] 97 % (08/12 0854)  Intake/Output from previous day: 08/11 0701 - 08/12 0700 In: 1318.9 [P.O.:720; I.V.:598.9] Out: -  Intake/Output this shift: No intake/output data recorded.  Recent Labs    12/17/19 0341  HGB 11.1*   Recent Labs    12/17/19 0341  WBC 12.0*  RBC 3.59*  HCT 33.1*  PLT PLATELET CLUMPS NOTED ON SMEAR, UNABLE TO ESTIMATE   Recent Labs    12/17/19 0341  NA 141  K 4.0  CL 105  CO2 27  BUN 16  CREATININE 1.19*  GLUCOSE 150*  CALCIUM 9.0   No results for input(s): LABPT, INR in the last 72 hours.  Sensation intact distally Intact pulses distally Dorsiflexion/Plantar flexion intact Incision: dressing C/D/I No cellulitis present Compartment soft   Assessment/Plan: 2 Days Post-Op Procedure(s) (LRB): LEFT TOTAL KNEE ARTHROPLASTY (Left) Up with therapy Discharge home with home health today.      Kathryne Hitch 12/18/2019, 9:41 AM

## 2019-12-18 NOTE — Progress Notes (Signed)
Physical Therapy Treatment Patient Details Name: [name] MRN: [id number] DOB: 1950/10/23 Today's Date: 12/18/2019    History of Present Illness Pt is a 69 y/o female s/p L TKA. PMH includes COPD.     PT Comments    Pt supine in bed on arrival.  Removed CPM and assisted patient with lower body dressing to prepare for return home.  Pt continues to benefit from skilled rehab in a home setting and plans for d/c home are today.     Follow Up Recommendations  Follow surgeons recommendation for DC plan and follow-up therapies     Equipment Recommendations  None recommended by PT    Recommendations for Other Services       Precautions / Restrictions Precautions Precautions: Knee Precaution Booklet Issued: No Restrictions Weight Bearing Restrictions: Yes LLE Weight Bearing: Weight bearing as tolerated    Mobility  Bed Mobility Overal bed mobility: Needs Assistance Bed Mobility: Supine to Sit     Supine to sit: Supervision     General bed mobility comments: Pt able to utilizeRLE to assist LLE out of bed.  No assistance this pm.  Transfers Overall transfer level: Needs assistance Equipment used: Rolling walker (2 wheeled) Transfers: Sit to/from Stand Sit to Stand: Supervision         General transfer comment: Cues for hand placement.  Ambulation/Gait Ambulation/Gait assistance: Supervision Gait Distance (Feet): 50 Feet Assistive device: Rolling walker (2 wheeled) Gait Pattern/deviations: Step-through pattern;Decreased stance time - left;Antalgic;Trunk flexed;Decreased dorsiflexion - left Gait velocity: Decreased    General Gait Details: Cues for L heel strike, foot flat and toe off.  Pt ambulating with L foot in plantar flexion due to decreased knee extension in stance phase.  Decreased distance due to pain.   Stairs             Wheelchair Mobility    Modified Rankin (Stroke Patients Only)       Balance Overall balance assessment: Needs  assistance Sitting-balance support: No upper extremity supported;Feet supported Sitting balance-Leahy Scale: Fair       Standing balance-Leahy Scale: Fair                              Cognition Arousal/Alertness: Awake/alert Behavior During Therapy: WFL for tasks assessed/performed Overall Cognitive Status: Within Functional Limits for tasks assessed                                        Exercises      General Comments        Pertinent Vitals/Pain Pain Assessment: 0-10 Pain Score: 6  Pain Location: L knee  Pain Descriptors / Indicators: Aching;Operative site guarding Pain Intervention(s): Monitored during session    Home Living                      Prior Function            PT Goals (current goals can now be found in the care plan section) Acute Rehab PT Goals Patient Stated Goal: to go home Potential to Achieve Goals: Good Progress towards PT goals: Progressing toward goals    Frequency    7X/week      PT Plan Current plan remains appropriate    Co-evaluation              AM-PAC PT "  6 Clicks" Mobility   Outcome Measure  Help needed turning from your back to your side while in a flat bed without using bedrails?: None Help needed moving from lying on your back to sitting on the side of a flat bed without using bedrails?: None Help needed moving to and from a bed to a chair (including a wheelchair)?: A Little Help needed standing up from a chair using your arms (e.g., wheelchair or bedside chair)?: A Little Help needed to walk in hospital room?: A Little Help needed climbing 3-5 steps with a railing? : A Little 6 Click Score: 20    End of Session Equipment Utilized During Treatment: Gait belt Activity Tolerance: Patient limited by pain Patient left: in chair;with call bell/phone within reach Nurse Communication: Mobility status PT Visit Diagnosis: Other abnormalities of gait and mobility  (R26.89);Pain Pain - Right/Left: Left Pain - part of body: Knee     Time: 1222-1242 PT Time Calculation (min) (ACUTE ONLY): 20 min  Charges:  $Gait Training: 8-22 mins                     Bonney Leitz , PTA Acute Rehabilitation Services Pager [phone] Office [phone]     Paulita Licklider Artis Delay 12/18/2019, 2:58 PM

## 2019-12-18 NOTE — Progress Notes (Signed)
Physical Therapy Treatment Patient Details Name: [name] MRN: [id number] DOB: Feb 18, 1951 Today's Date: 12/18/2019    History of Present Illness Pt is a 69 y/o female s/p L TKA. PMH includes COPD.     PT Comments    Pt supine in bed on arrival this session.  Pt required assistance to move into and out of bed due to increased pain, in fact she required increased assistance throughout.  Pain continues to limit function.  Post session patient placed in CPM -2-70 degrees to promote increased ROM and reduce stiffness.  Plan for d/c home today per chart.  Will f/u in pm if patient remains hospitalized.     Follow Up Recommendations  Follow surgeon's recommendation for DC plan and follow-up therapies     Equipment Recommendations  None recommended by PT    Recommendations for Other Services       Precautions / Restrictions Precautions Precautions: Knee Precaution Booklet Issued: No Precaution Comments: Verbally reviewed knee precautions  Restrictions Weight Bearing Restrictions: Yes LLE Weight Bearing: Weight bearing as tolerated    Mobility  Bed Mobility Overal bed mobility: Needs Assistance Bed Mobility: Supine to Sit;Sit to Supine     Supine to sit: Min assist Sit to supine: Min assist   General bed mobility comments: Min assistance for LLE into and out of the bed this am.  Pt appears to be in more pain.  Transfers Overall transfer level: Needs assistance Equipment used: Rolling walker (2 wheeled) Transfers: Sit to/from Stand Sit to Stand: Min assist         General transfer comment: Increased assistance to rise into standing due to pain.  Performed transfer from elevated commode with min guard assistance.  Ambulation/Gait Ambulation/Gait assistance: Min guard Gait Distance (Feet): 60 Feet Assistive device: Rolling walker (2 wheeled) Gait Pattern/deviations: Step-through pattern;Decreased stance time - left;Antalgic;Trunk flexed;Decreased dorsiflexion -  left Gait velocity: Decreased    General Gait Details: Cues for L heel strike, foot flat and toe off.  Pt ambulating with L foot in plantar flexion due to decreased knee extension in stance phase.  Decreased distance due to pain.   Stairs             Wheelchair Mobility    Modified Rankin (Stroke Patients Only)       Balance Overall balance assessment: Needs assistance Sitting-balance support: No upper extremity supported;Feet supported Sitting balance-Leahy Scale: Fair       Standing balance-Leahy Scale: Poor                              Cognition Arousal/Alertness: Awake/alert Behavior During Therapy: WFL for tasks assessed/performed Overall Cognitive Status: Within Functional Limits for tasks assessed                                        Exercises Total Joint Exercises Ankle Circles/Pumps: AROM;Both;Supine;20 reps Quad Sets: AROM;Left;10 reps;Supine Heel Slides: AAROM;Left;10 reps;Supine Hip ABduction/ADduction: AAROM;Left;10 reps;Supine Straight Leg Raises: AAROM;Left;10 reps;Supine Goniometric ROM: 9-70 degrees flexion L knee.    General Comments        Pertinent Vitals/Pain Pain Assessment: 0-10 Pain Score: 6  Pain Location: L knee  Pain Descriptors / Indicators: Aching;Operative site guarding Pain Intervention(s): Monitored during session;Repositioned    Home Living  Prior Function            PT Goals (current goals can now be found in the care plan section) Acute Rehab PT Goals Patient Stated Goal: to go home Potential to Achieve Goals: Good Progress towards PT goals: Progressing toward goals    Frequency    7X/week      PT Plan Current plan remains appropriate    Co-evaluation              AM-PAC PT "6 Clicks" Mobility   Outcome Measure  Help needed turning from your back to your side while in a flat bed without using bedrails?: A Little Help needed moving  from lying on your back to sitting on the side of a flat bed without using bedrails?: A Little Help needed moving to and from a bed to a chair (including a wheelchair)?: A Little Help needed standing up from a chair using your arms (e.g., wheelchair or bedside chair)?: A Little Help needed to walk in hospital room?: A Little Help needed climbing 3-5 steps with a railing? : A Little 6 Click Score: 18    End of Session Equipment Utilized During Treatment: Gait belt Activity Tolerance: Patient limited by pain Patient left: in bed;with call bell/phone within reach;with bed alarm set Nurse Communication: Mobility status PT Visit Diagnosis: Other abnormalities of gait and mobility (R26.89);Pain Pain - Right/Left: Left Pain - part of body: Knee     Time: 7893-8101 PT Time Calculation (min) (ACUTE ONLY): 36 min  Charges:  $Gait Training: 8-22 mins $Therapeutic Exercise: 8-22 mins                     Bonney Leitz , PTA Acute Rehabilitation Services Pager [phone] Office [phone]     Danniel Grenz Artis Delay 12/18/2019, 10:46 AM

## 2019-12-18 NOTE — Telephone Encounter (Signed)
Spoke with [facility], they said they were holding this because she just got #90 of hydrocodone from her PCP at the beginning of this month Per Dr. Magnus Ivan we approved them to fill the oxycodone

## 2019-12-18 NOTE — Discharge Summary (Signed)
Patient ID: [name] MRN: [id number] DOB/AGE: 1950-12-03 69 y.o.  Admit date: 12/16/2019 Discharge date: 12/18/2019  Admission Diagnoses:  Principal Problem:   Unilateral primary osteoarthritis, left knee Active Problems:   Status post total left knee replacement   Discharge Diagnoses:  Same  Past Medical History:  Diagnosis Date  . Anxiety   . Arthritis   . COPD (chronic obstructive pulmonary disease) (HCC)   . Depression   . GERD (gastroesophageal reflux disease)   . History of hiatal hernia   . RLS (restless legs syndrome)     Surgeries: Procedure(s): LEFT TOTAL KNEE ARTHROPLASTY on 12/16/2019   Consultants:   Discharged Condition: Improved  Hospital Course: Melika Reder is an 69 y.o. female who was admitted 12/16/2019 for operative treatment ofUnilateral primary osteoarthritis, left knee. Patient has severe unremitting pain that affects sleep, daily activities, and work/hobbies. After pre-op clearance the patient was taken to the operating room on 12/16/2019 and underwent  Procedure(s): LEFT TOTAL KNEE ARTHROPLASTY.    Patient was given perioperative antibiotics:  Anti-infectives (From admission, onward)   Start     Dose/Rate Route Frequency Ordered Stop   12/16/19 1700  ceFAZolin (ANCEF) IVPB 1 g/50 mL premix        1 g 100 mL/hr over 30 Minutes Intravenous Every 6 hours 12/16/19 1659 12/17/19 0459   12/16/19 1015  ceFAZolin (ANCEF) IVPB 2g/100 mL premix        2 g 200 mL/hr over 30 Minutes Intravenous On call to O.R. 12/16/19 1000 12/16/19 1252       Patient was given sequential compression devices, early ambulation, and chemoprophylaxis to prevent DVT.  Patient benefited maximally from hospital stay and there were no complications.    Recent vital signs:  Patient Vitals for the past 24 hrs:  BP Temp Temp src Pulse Resp SpO2  12/18/19 0854 120/81 99 F (37.2 C) Oral 98 17 97 %  12/18/19 0400 (!) 166/80 98.2 F (36.8 C) Oral (!) 101 18 94 %   12/17/19 1900 123/69 98.3 F (36.8 C) Oral 88 17 96 %  12/17/19 1403 127/65 98.5 F (36.9 C) Oral 85 15 97 %     Recent laboratory studies:  Recent Labs    12/17/19 0341  WBC 12.0*  HGB 11.1*  HCT 33.1*  PLT PLATELET CLUMPS NOTED ON SMEAR, UNABLE TO ESTIMATE  NA 141  K 4.0  CL 105  CO2 27  BUN 16  CREATININE 1.19*  GLUCOSE 150*  CALCIUM 9.0     Discharge Medications:   Allergies as of 12/18/2019   No Known Allergies     Medication List    STOP taking these medications   aspirin EC 81 MG tablet Replaced by: aspirin 81 MG chewable tablet   HYDROcodone-acetaminophen 5-325 MG tablet Commonly known as: NORCO/VICODIN     TAKE these medications   acetaminophen 500 MG tablet Commonly known as: TYLENOL Take 500-1,000 mg by mouth every 8 (eight) hours as needed for moderate pain.   aspirin 81 MG chewable tablet Chew 1 tablet (81 mg total) by mouth 2 (two) times daily. Replaces: aspirin EC 81 MG tablet   baclofen 10 MG tablet Commonly known as: LIORESAL Take 10 mg by mouth 2 (two) times daily as needed for muscle spasms.   buPROPion 150 MG 24 hr tablet Commonly known as: WELLBUTRIN XL Take 150 mg by mouth every morning.   carbidopa-levodopa 25-100 MG tablet Commonly known as: SINEMET IR Take 1 tablet by mouth  in the morning and at bedtime.   CINNAMON PO Take 2,000 mg by mouth daily.   Garlic 1000 MG Caps Take 1,000 mg by mouth daily.   Garlique 400 MG Tbec Generic drug: Garlic Take 400 mg by mouth daily.   LORazepam 0.5 MG tablet Commonly known as: ATIVAN Take 0.5 mg by mouth in the morning, at noon, and at bedtime.   meloxicam 15 MG tablet Commonly known as: MOBIC Take 15 mg by mouth daily.   oxyCODONE 5 MG immediate release tablet Commonly known as: Oxy IR/ROXICODONE Take 1-2 tablets (5-10 mg total) by mouth every 4 (four) hours as needed for moderate pain (pain score 4-6).   pantoprazole 40 MG tablet Commonly known as: PROTONIX Take 40 mg  by mouth daily.   rOPINIRole 1 MG tablet Commonly known as: REQUIP Take 1 mg by mouth in the morning and at bedtime.   rosuvastatin 10 MG tablet Commonly known as: CRESTOR Take 10 mg by mouth 3 (three) times a week.   Vitamin D 50 MCG (2000 UT) tablet Take 2,000 Units by mouth daily.            Durable Medical Equipment  (From admission, onward)         Start     Ordered   12/16/19 1700  DME 3 n 1  Once        12/16/19 1659   12/16/19 1700  DME Walker rolling  Once       Question Answer Comment  Walker: With 5 Inch Wheels   Patient needs a walker to treat with the following condition Status post total left knee replacement      12/16/19 1659          Diagnostic Studies: DG Knee Left Port  Result Date: 12/16/2019 CLINICAL DATA:  Status post total knee replacement EXAM: PORTABLE LEFT KNEE - 1-2 VIEW COMPARISON:  October 29, 2019 FINDINGS: Frontal and lateral views were obtained. Patient is status post total knee replacement with femoral, tibial, and patellar prosthetic components well-seated. No fracture or dislocation. No erosion. Soft tissue air within the joint is an expected postoperative finding. IMPRESSION: Status post total knee replacement with prosthetic components well-seated. No fracture or dislocation. Acute postoperative changes noted. Electronically Signed   By: Bretta Bang III M.D.   On: 12/16/2019 14:55    Disposition: Discharge disposition: 01-Home or Self Care          Follow-up Information    Kathryne Hitch, MD Follow up in 2 week(s).   Specialty: Orthopedic Surgery Contact information: [address] [place] [address] [phone]                Signed: Kathryne Hitch 12/18/2019, 9:42 AM

## 2019-12-29 ENCOUNTER — Inpatient Hospital Stay: Payer: Medicare Other | Admitting: Physician Assistant

## 2020-01-01 ENCOUNTER — Encounter: Payer: Self-pay | Admitting: Physician Assistant

## 2020-01-01 ENCOUNTER — Ambulatory Visit (INDEPENDENT_AMBULATORY_CARE_PROVIDER_SITE_OTHER): Payer: Medicare Other | Admitting: Physician Assistant

## 2020-01-01 DIAGNOSIS — Z96652 Presence of left artificial knee joint: Secondary | ICD-10-CM

## 2020-01-01 NOTE — Progress Notes (Signed)
HPI: Mrs. Osborne returns today 2 weeks status post left total knee arthroplasty.  She has had some swelling about the knee but no calf pain.  She been on aspirin for DVT prophylaxis.  No shortness of breath fevers or chills.  She does state that her hydrocodone works better than her oxycodone.  Her PCP has been prescribing this for her in the past.  Physical exam: Left knee she has near full extension lacking only a few degrees.  Flexion to 85 degrees.  Surgical incisions well approximated staples.  Calf supple nontender.  Dorsiflexion plantarflexion left ankle intact.  Impression: Status post left total knee arthroplasty 12/16/2019  Plan: Staples removed Steri-Strips applied.  She will continue work on range of motion strengthening.  We will send her to physical therapy in [facility] for range of motion strengthening of the left knee.  Scar tissue mobilization encouraged.  Questions were encouraged and answered.  She should go she should go back on her 81 mg aspirin that she was on prior to surgery    .

## 2020-01-06 ENCOUNTER — Telehealth: Payer: Self-pay | Admitting: Physician Assistant

## 2020-01-06 NOTE — Telephone Encounter (Signed)
Please advise 

## 2020-01-06 NOTE — Telephone Encounter (Signed)
The patient can drive from my standpoint since it is her left knee that we performed a knee replacement on.  As long as she is not taking a narcotic and getting behind the wheel she is cleared to drive.

## 2020-01-06 NOTE — Telephone Encounter (Signed)
Called and advised.

## 2020-01-06 NOTE — Telephone Encounter (Signed)
Patient called. She would like to know when she can drive. Her call back number is [phone]

## 2020-01-29 ENCOUNTER — Ambulatory Visit (INDEPENDENT_AMBULATORY_CARE_PROVIDER_SITE_OTHER): Payer: Medicare Other | Admitting: Physician Assistant

## 2020-01-29 ENCOUNTER — Encounter: Payer: Self-pay | Admitting: Physician Assistant

## 2020-01-29 DIAGNOSIS — Z96652 Presence of left artificial knee joint: Secondary | ICD-10-CM

## 2020-01-29 NOTE — Progress Notes (Signed)
HPI: Ms. Greenfield returns today follow-up of her left total knee arthroplasty.  She is now 6 weeks postop.  She is overall doing well.  She feels she is making progress with physical therapy.  She complains of worse pain in the right knee which she has known arthritis.  States her right knee was is doing as well as her left that she would not need a cane.  Physical exam: Left knee full extension flexion to 95 degrees actively.  Passively I can bring her to 100 degrees.  Left calf supple nontender.  Surgical incisions healing well no signs of infection.  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  We will see her back in 1 month to see what type of progress she is made.  In regards to the scar she will work on scar tissue mobilization and desensitizing the area.  Questions were encouraged and answered.

## 2020-02-26 ENCOUNTER — Encounter: Payer: Self-pay | Admitting: Physician Assistant

## 2020-02-26 ENCOUNTER — Ambulatory Visit (INDEPENDENT_AMBULATORY_CARE_PROVIDER_SITE_OTHER): Payer: Medicare Other | Admitting: Physician Assistant

## 2020-02-26 DIAGNOSIS — Z96652 Presence of left artificial knee joint: Secondary | ICD-10-CM

## 2020-02-26 NOTE — Progress Notes (Signed)
HPI: Mrs. Lebeck returns today status post left total knee arthroplasty 12/16/2019.  She states the left knee is doing well.  Actually is her stronger knee at this point time.  She has known osteoarthritis with bone-on-bone of the right knee medial compartment.  She states she is working with physical therapy on range of motion strengthening.  Physical exam: Left knee full extension and  flexion to 105 to 107 degrees.  No instability valgus varus stressing.  Calf soft nontender.  Surgical incision is healing well.  Impression: Status post left total knee arthroplasty 12/16/19  Plan: She will continue work on range of motion strengthening of the left knee with physical therapy.  We will see her back in 4 months to see what type of progress she is made.  Sooner if there is any questions concerns.  Likely AP and lateral view of the left knee at that time.  Questions were encouraged and answered.

## 2020-03-17 ENCOUNTER — Ambulatory Visit: Payer: Medicare Other | Admitting: Family Medicine

## 2020-03-19 ENCOUNTER — Ambulatory Visit: Payer: Self-pay

## 2020-03-19 ENCOUNTER — Other Ambulatory Visit: Payer: Self-pay

## 2020-03-19 ENCOUNTER — Encounter: Payer: Self-pay | Admitting: Family Medicine

## 2020-03-19 ENCOUNTER — Ambulatory Visit (INDEPENDENT_AMBULATORY_CARE_PROVIDER_SITE_OTHER): Payer: Medicare Other | Admitting: Family Medicine

## 2020-03-19 DIAGNOSIS — M25551 Pain in right hip: Secondary | ICD-10-CM

## 2020-03-19 NOTE — Progress Notes (Signed)
Subjective: Patient is here for recurrent right hip pain.  She did well with injection in May, with relief until about 2 or 3 weeks ago.  She would like another ultrasound-guided intra-articular right hip injection.   She is hoping to undergo knee replacement surgery at the beginning of the year.  Objective: She has pain with passive internal rotation.  Impression: Right hip DJD  Plan: Injection given today.  Follow-up as needed.  Procedure: Ultrasound guided injection is preferred based studies that show increased duration, increased effect, greater accuracy, decreased procedural pain, increased response rate, and decreased cost with ultrasound guided versus blind injection.   Verbal informed consent obtained.  Time-out conducted.  Noted no overlying erythema, induration, or other signs of local infection. Ultrasound-guided right hip injection: After sterile prep with Betadine, injected 8 cc 1% lidocaine without epinephrine and 40 mg methylprednisolone using a 22-gauge spinal needle, passing the needle through the iliofemoral ligament into the femoral head/neck junction.  Injectate seen filling the joint capsule.

## 2020-05-12 ENCOUNTER — Other Ambulatory Visit: Payer: Self-pay

## 2020-05-13 NOTE — Progress Notes (Signed)
[facility] [address] [place] - [address] [place] [address] Phone: [phone] Fax: [phone]  [facility] - [place], [place] - [address] [place], [address] [place], [address] [place] [address] Phone: [phone] Fax: [phone]  [id number] - [place], [place] - [address] AT [facility] [address] [place] [address] Phone: [phone] Fax: [phone]      Your procedure is scheduled on 05/18/20.  Report to [facility] Main Entrance "A" at 12:10 P.M., and check in at the Admitting office.  Call this number if you have problems the morning of surgery:  [phone]  Call [phone] if you have any questions prior to your surgery date Monday-Friday 8am-4pm    Remember:  Do not eat or drink after midnight the night before your surgery  You may drink clear liquids until  the morning of your surgery.   Clear liquids allowed are: Water, Non-Citrus Juices (without pulp), Carbonated Beverages, Clear Tea, Black Coffee Only, and Gatorade    Take these medicines the morning of surgery with A SIP OF WATER  acetaminophen (TYLENOL)  baclofen (LIORESAL) buPROPion (WELLBUTRIN XL) carbidopa-levodopa (SINEMET IR) LORazepam (ATIVAN)  pantoprazole (PROTONIX)   As of today, STOP taking any Aspirin (unless otherwise instructed by your surgeon) Aleve, Naproxen, Ibuprofen, Motrin, Advil, Goody's, BC's, all herbal medications, fish oil, and all vitamins.                      Do not wear jewelry, make up, or nail polish            Do not wear lotions, powders, perfumes/colognes, or deodorant.            Do not shave 48 hours prior to surgery.  Men may shave face and neck.            Do not bring valuables to the hospital.            [facility] is not responsible for any belongings or valuables.  Do NOT Smoke (Tobacco/Vaping) or drink Alcohol 24 hours prior to your  procedure If you use a CPAP at night, you may bring all equipment for your overnight stay.   Contacts, glasses, dentures or bridgework may not be worn into surgery.      For patients admitted to the hospital, discharge time will be determined by your treatment team.   Patients discharged the day of surgery will not be allowed to drive home, and someone needs to stay with them for 24 hours.    Special instructions:   [facility]- Preparing For Surgery  Before surgery, you can play an important role. Because skin is not sterile, your skin needs to be as free of germs as possible. You can reduce the number of germs on your skin by washing with CHG (chlorahexidine gluconate) Soap before surgery.  CHG is an antiseptic cleaner which kills germs and bonds with the skin to continue killing germs even after washing.    Oral Hygiene is also important to reduce your risk of infection.  Remember - BRUSH YOUR TEETH THE MORNING OF SURGERY WITH YOUR REGULAR TOOTHPASTE  Please do not use if you have an allergy to CHG or antibacterial soaps. If your skin becomes reddened/irritated stop using the CHG.  Do not shave (including legs and underarms) for at least 48 hours prior to first CHG shower. It is  OK to shave your face.  Please follow these instructions carefully.   1. Shower the NIGHT BEFORE SURGERY and the MORNING OF SURGERY with CHG Soap.   2. If you chose to wash your hair, wash your hair first as usual with your normal shampoo.  3. After you shampoo, rinse your hair and body thoroughly to remove the shampoo.  4. Use CHG as you would any other liquid soap. You can apply CHG directly to the skin and wash gently with a scrungie or a clean washcloth.   5. Apply the CHG Soap to your body ONLY FROM THE NECK DOWN.  Do not use on open wounds or open sores. Avoid contact with your eyes, ears, mouth and genitals (private parts). Wash Face and genitals (private parts)  with your normal soap.   6. Wash  thoroughly, paying special attention to the area where your surgery will be performed.  7. Thoroughly rinse your body with warm water from the neck down.  8. DO NOT shower/wash with your normal soap after using and rinsing off the CHG Soap.  9. Pat yourself dry with a CLEAN TOWEL.  10. Wear CLEAN PAJAMAS to bed the night before surgery  11. Place CLEAN SHEETS on your bed the night of your first shower and DO NOT SLEEP WITH PETS.   Day of Surgery: Wear Clean/Comfortable clothing the morning of surgery Do not apply any deodorants/lotions.   Remember to brush your teeth WITH YOUR REGULAR TOOTHPASTE.   Please read over the following fact sheets that you were given.

## 2020-05-14 ENCOUNTER — Encounter (HOSPITAL_COMMUNITY): Payer: Self-pay

## 2020-05-14 ENCOUNTER — Other Ambulatory Visit: Payer: Self-pay

## 2020-05-14 ENCOUNTER — Encounter (HOSPITAL_COMMUNITY)
Admission: RE | Admit: 2020-05-14 | Discharge: 2020-05-14 | Disposition: A | Discharge status: Home / Self Care | Payer: Medicare Other | Source: Ambulatory Visit | Attending: Orthopaedic Surgery | Admitting: Orthopaedic Surgery

## 2020-05-14 ENCOUNTER — Other Ambulatory Visit (HOSPITAL_COMMUNITY)
Admission: RE | Admit: 2020-05-14 | Discharge: 2020-05-14 | Disposition: A | Discharge status: Home / Self Care | Payer: Medicare Other | Source: Ambulatory Visit | Attending: Orthopaedic Surgery | Admitting: Orthopaedic Surgery

## 2020-05-14 DIAGNOSIS — Z20822 Contact with and (suspected) exposure to covid-19: Secondary | ICD-10-CM | POA: Insufficient documentation

## 2020-05-14 DIAGNOSIS — Z01812 Encounter for preprocedural laboratory examination: Secondary | ICD-10-CM | POA: Diagnosis not present

## 2020-05-14 LAB — BASIC METABOLIC PANEL
Anion gap: 10 (ref 5–15)
BUN: 13 mg/dL (ref 8–23)
CO2: 29 mmol/L (ref 22–32)
Calcium: 10.8 mg/dL — ABNORMAL HIGH (ref 8.9–10.3)
Chloride: 105 mmol/L (ref 98–111)
Creatinine, Ser: 0.9 mg/dL (ref 0.44–1.00)
GFR, Estimated: >60 mL/min (ref >60)
Glucose, Bld: 90 mg/dL (ref 70–99)
Potassium: 4.1 mmol/L (ref 3.5–5.1)
Sodium: 144 mmol/L (ref 135–145)

## 2020-05-14 LAB — CBC
HCT: 42.9 % (ref 36.0–46.0)
Hemoglobin: 14.3 g/dL (ref 12.0–15.0)
MCH: 29.4 pg (ref 26.0–34.0)
MCHC: 33.3 g/dL (ref 30.0–36.0)
MCV: 88.3 fL (ref 80.0–100.0)
Platelets: 154 10*3/uL (ref 150–400)
RBC: 4.86 MIL/uL (ref 3.87–5.11)
RDW: 14 % (ref 11.5–15.5)
WBC: 7.9 10*3/uL (ref 4.0–10.5)
nRBC: 0 % (ref 0.0–0.2)

## 2020-05-14 LAB — SURGICAL PCR SCREEN
MRSA, PCR: NEGATIVE
Staphylococcus aureus: POSITIVE — AB

## 2020-05-14 LAB — SARS CORONAVIRUS 2 (TAT 6-24 HRS): SARS Coronavirus 2: NEGATIVE

## 2020-05-14 NOTE — Progress Notes (Signed)
[facility] [address], [place] - [address] [place] [address] Phone: [phone] Fax: [phone]  [facility] - [place], [place] - [address] [place], [address] [place], [address] [place] [address] Phone: [phone] Fax: [phone]  [id number] - [place], [place] - [address] AT [facility] [address] [place] [address] [place] [address] Phone: [phone] Fax: [phone]      Your procedure is scheduled on Tuesday, January 11th.  Report to [facility] Entrance at 12:10 P.M., and check in at the Admitting office.  Call this number if you have problems the morning of surgery:  [phone]  Call [phone] if you have any questions prior to your surgery date Monday-Friday 8am-4pm    Remember:  Do not eat after midnight the night before your surgery  You may drink clear liquids until 11:00 AM the morning of your surgery.   Clear liquids allowed are: Water, Non-Citrus Juices (without pulp), Carbonated Beverages, Clear Tea, Black Coffee Only, and Gatorade    Take these medicines the morning of surgery with A SIP OF WATER   acetaminophen (TYLENOL)   baclofen (LIORESAL)  buPROPion (WELLBUTRIN XL)  carbidopa-levodopa (SINEMET IR)  LORazepam (ATIVAN)   pantoprazole (PROTONIX)   As of today, STOP taking any Aspirin (unless otherwise instructed by your surgeon) Aleve, Naproxen, Ibuprofen, Motrin, Advil, Goody's, BC's, all herbal medications, fish oil, and all vitamins.                      Do not wear jewelry, make up, or nail polish            Do not wear lotions, powders, perfumes, or deodorant.            Do not shave 48 hours prior to surgery.              Do not bring valuables to the hospital.            [facility] is not responsible for any belongings or valuables.  Do NOT Smoke (Tobacco/Vaping) or drink Alcohol 24 hours prior to your procedure If you use a CPAP  at night, you may bring all equipment for your overnight stay.   Contacts, glasses, dentures or bridgework may not be worn into surgery.      For patients admitted to the hospital, discharge time will be determined by your treatment team.   Patients discharged the day of surgery will not be allowed to drive home, and someone needs to stay with them for 24 hours.    Special instructions:   [facility]- Preparing For Surgery  Before surgery, you can play an important role. Because skin is not sterile, your skin needs to be as free of germs as possible. You can reduce the number of germs on your skin by washing with CHG (chlorahexidine gluconate) Soap before surgery.  CHG is an antiseptic cleaner which kills germs and bonds with the skin to continue killing germs even after washing.    Oral Hygiene is also important to reduce your risk of infection.  Remember - BRUSH YOUR TEETH THE MORNING OF SURGERY WITH YOUR REGULAR TOOTHPASTE  Please do not use if you have an allergy to CHG or antibacterial soaps. If your skin becomes reddened/irritated stop using the CHG.  Do not shave (including legs and underarms) for at least 48 hours prior to first CHG shower. It  is OK to shave your face.  Please follow these instructions carefully.   1. Shower the NIGHT BEFORE SURGERY and the MORNING OF SURGERY with CHG Soap.   2. If you chose to wash your hair, wash your hair first as usual with your normal shampoo.  3. After you shampoo, rinse your hair and body thoroughly to remove the shampoo.  4. Use CHG as you would any other liquid soap. You can apply CHG directly to the skin and wash gently with a scrungie or a clean washcloth.   5. Apply the CHG Soap to your body ONLY FROM THE NECK DOWN.  Do not use on open wounds or open sores. Avoid contact with your eyes, ears, mouth and genitals (private parts). Wash Face and genitals (private parts)  with your normal soap.   6. Wash thoroughly, paying special  attention to the area where your surgery will be performed.  7. Thoroughly rinse your body with warm water from the neck down.  8. DO NOT shower/wash with your normal soap after using and rinsing off the CHG Soap.  9. Pat yourself dry with a CLEAN TOWEL.  10. Wear CLEAN PAJAMAS to bed the night before surgery  11. Place CLEAN SHEETS on your bed the night of your first shower and DO NOT SLEEP WITH PETS.   Day of Surgery: Wear Clean/Comfortable clothing the morning of surgery Do not apply any deodorants/lotions.   Remember to brush your teeth WITH YOUR REGULAR TOOTHPASTE.   Please read over the following fact sheets that you were given.

## 2020-05-14 NOTE — Progress Notes (Signed)
PCP - DR VYAS Cardiologist - NA   -   Chest x-ray - NA EKG -NA Stress Test -  ECHO - NACardiac Cath - NA     Blood Thinner Instructions:NONE     Aspirin Instructions:STOP TODAY  ERAS Protcol -INSTRUCTIONS GIVEN PRE-SURGERY Ensure or G2- NO DRINK  COVID TEST- 05/14/20   Anesthesia review: NA       PT IS GOING TO [facility]  Patient denies shortness of breath, fever, cough and chest pain at PAT appointment   All instructions explained to the patient, with a verbal understanding of the material. Patient agrees to go over the instructions while at home for a better understanding. Patient also instructed to self quarantine after being tested for COVID-19. The opportunity to ask questions was provided.

## 2020-05-18 ENCOUNTER — Observation Stay (HOSPITAL_COMMUNITY): Payer: Medicare Other

## 2020-05-18 ENCOUNTER — Encounter (HOSPITAL_COMMUNITY)
Admission: RE | Disposition: A | Discharge status: Home / Self Care | Payer: Self-pay | Source: Home / Self Care | Attending: Orthopaedic Surgery

## 2020-05-18 ENCOUNTER — Ambulatory Visit (HOSPITAL_COMMUNITY): Payer: Medicare Other | Admitting: Vascular Surgery

## 2020-05-18 ENCOUNTER — Ambulatory Visit (HOSPITAL_COMMUNITY): Payer: Medicare Other | Admitting: Certified Registered Nurse Anesthetist

## 2020-05-18 ENCOUNTER — Encounter (HOSPITAL_COMMUNITY): Payer: Self-pay | Admitting: Orthopaedic Surgery

## 2020-05-18 ENCOUNTER — Observation Stay (HOSPITAL_COMMUNITY)
Admission: RE | Admit: 2020-05-18 | Discharge: 2020-05-20 | Disposition: A | Discharge status: Home / Self Care | Payer: Medicare Other | Attending: Orthopaedic Surgery | Admitting: Orthopaedic Surgery

## 2020-05-18 DIAGNOSIS — J449 Chronic obstructive pulmonary disease, unspecified: Secondary | ICD-10-CM | POA: Insufficient documentation

## 2020-05-18 DIAGNOSIS — M1711 Unilateral primary osteoarthritis, right knee: Principal | ICD-10-CM | POA: Insufficient documentation

## 2020-05-18 DIAGNOSIS — Z96651 Presence of right artificial knee joint: Secondary | ICD-10-CM

## 2020-05-18 DIAGNOSIS — F17210 Nicotine dependence, cigarettes, uncomplicated: Secondary | ICD-10-CM | POA: Insufficient documentation

## 2020-05-18 DIAGNOSIS — Z96652 Presence of left artificial knee joint: Secondary | ICD-10-CM | POA: Insufficient documentation

## 2020-05-18 HISTORY — PX: TOTAL KNEE ARTHROPLASTY: SHX125

## 2020-05-18 SURGERY — ARTHROPLASTY, KNEE, TOTAL
Anesthesia: Monitor Anesthesia Care | Site: Knee | Laterality: Right

## 2020-05-18 MED ORDER — FENTANYL CITRATE (PF) 100 MCG/2ML IJ SOLN
INTRAMUSCULAR | Status: AC
  Filled 2020-05-18: qty 2

## 2020-05-18 MED ORDER — OXYCODONE HCL 5 MG PO TABS: 5.0000 mg | ORAL_TABLET | ORAL | Status: DC | PRN

## 2020-05-18 MED ORDER — SODIUM CHLORIDE 0.9 % IV SOLN: INTRAVENOUS | Status: DC

## 2020-05-18 MED ORDER — DIPHENHYDRAMINE HCL 12.5 MG/5ML PO ELIX
12.5000 mg | ORAL_SOLUTION | ORAL | Status: DC | PRN
  Administered 2020-05-19: 25 mg via ORAL
  Filled 2020-05-18 (×2): qty 10

## 2020-05-18 MED ORDER — TRANEXAMIC ACID-NACL 1000-0.7 MG/100ML-% IV SOLN
INTRAVENOUS | Status: DC | PRN
  Administered 2020-05-18: 1000 mg via INTRAVENOUS

## 2020-05-18 MED ORDER — OXYCODONE HCL 5 MG PO TABS
5.0000 mg | ORAL_TABLET | Freq: Once | ORAL | Status: AC | PRN
  Administered 2020-05-18: 5 mg via ORAL

## 2020-05-18 MED ORDER — METOCLOPRAMIDE HCL 5 MG/ML IJ SOLN: 5.0000 mg | Freq: Three times a day (TID) | INTRAMUSCULAR | Status: DC | PRN

## 2020-05-18 MED ORDER — MENTHOL 3 MG MT LOZG: 1.0000 | LOZENGE | OROMUCOSAL | Status: DC | PRN

## 2020-05-18 MED ORDER — ROSUVASTATIN CALCIUM 10 MG PO TABS
10.0000 mg | ORAL_TABLET | ORAL | Status: DC
  Administered 2020-05-19: 10 mg via ORAL
  Filled 2020-05-18: qty 1

## 2020-05-18 MED ORDER — HYDROMORPHONE HCL 1 MG/ML IJ SOLN
INTRAMUSCULAR | Status: AC
  Filled 2020-05-18: qty 1

## 2020-05-18 MED ORDER — ROPINIROLE HCL 1 MG PO TABS
1.0000 mg | ORAL_TABLET | Freq: Every evening | ORAL | Status: DC | PRN
  Administered 2020-05-18 – 2020-05-19 (×2): 1 mg via ORAL
  Filled 2020-05-18 (×3): qty 1

## 2020-05-18 MED ORDER — BUPROPION HCL ER (XL) 150 MG PO TB24
150.0000 mg | ORAL_TABLET | Freq: Every morning | ORAL | Status: DC
  Administered 2020-05-19 – 2020-05-20 (×2): 150 mg via ORAL
  Filled 2020-05-18 (×2): qty 1

## 2020-05-18 MED ORDER — OXYCODONE HCL 5 MG PO TABS: 5.0000 mg | ORAL_TABLET | Freq: Four times a day (QID) | ORAL | 0 refills | Status: DC | PRN

## 2020-05-18 MED ORDER — FENTANYL CITRATE (PF) 100 MCG/2ML IJ SOLN
50.0000 ug | INTRAMUSCULAR | Status: AC
  Administered 2020-05-18: 100 ug via INTRAVENOUS
  Filled 2020-05-18: qty 2

## 2020-05-18 MED ORDER — ONDANSETRON HCL 4 MG/2ML IJ SOLN: 4.0000 mg | Freq: Once | INTRAMUSCULAR | Status: DC | PRN

## 2020-05-18 MED ORDER — SODIUM CHLORIDE 0.9 % IR SOLN
Status: DC | PRN
  Administered 2020-05-18: 1000 mL

## 2020-05-18 MED ORDER — BUPIVACAINE-EPINEPHRINE (PF) 0.25% -1:200000 IJ SOLN
INTRAMUSCULAR | Status: AC
  Filled 2020-05-18: qty 30

## 2020-05-18 MED ORDER — METHOCARBAMOL 500 MG PO TABS
ORAL_TABLET | ORAL | Status: AC
  Filled 2020-05-18: qty 1

## 2020-05-18 MED ORDER — PANTOPRAZOLE SODIUM 40 MG PO TBEC
40.0000 mg | DELAYED_RELEASE_TABLET | Freq: Every day | ORAL | Status: DC
  Administered 2020-05-19 – 2020-05-20 (×2): 40 mg via ORAL
  Filled 2020-05-18 (×3): qty 1

## 2020-05-18 MED ORDER — LORAZEPAM 0.5 MG PO TABS
0.5000 mg | ORAL_TABLET | Freq: Two times a day (BID) | ORAL | Status: DC | PRN
  Administered 2020-05-18 – 2020-05-19 (×3): 0.5 mg via ORAL
  Filled 2020-05-18 (×4): qty 1

## 2020-05-18 MED ORDER — METHOCARBAMOL 500 MG PO TABS
500.0000 mg | ORAL_TABLET | Freq: Four times a day (QID) | ORAL | Status: DC | PRN
  Administered 2020-05-18 – 2020-05-20 (×4): 500 mg via ORAL
  Filled 2020-05-18 (×3): qty 1

## 2020-05-18 MED ORDER — OXYCODONE HCL 5 MG/5ML PO SOLN: 5.0000 mg | Freq: Once | ORAL | Status: AC | PRN

## 2020-05-18 MED ORDER — BUPIVACAINE IN DEXTROSE 0.75-8.25 % IT SOLN
INTRATHECAL | Status: DC | PRN
  Administered 2020-05-18: 1.8 mL via INTRATHECAL

## 2020-05-18 MED ORDER — BUPIVACAINE-EPINEPHRINE 0.25% -1:200000 IJ SOLN
INTRAMUSCULAR | Status: DC | PRN
  Administered 2020-05-18: 30 mL

## 2020-05-18 MED ORDER — ACETAMINOPHEN 325 MG PO TABS
ORAL_TABLET | ORAL | Status: AC
  Administered 2020-05-18: 650 mg via ORAL
  Filled 2020-05-18: qty 2

## 2020-05-18 MED ORDER — CARBIDOPA-LEVODOPA 25-100 MG PO TABS
1.0000 | ORAL_TABLET | Freq: Two times a day (BID) | ORAL | Status: DC
  Administered 2020-05-18 – 2020-05-20 (×4): 1 via ORAL
  Filled 2020-05-18 (×4): qty 1

## 2020-05-18 MED ORDER — HYDROMORPHONE HCL 1 MG/ML IJ SOLN
0.5000 mg | INTRAMUSCULAR | Status: DC | PRN
  Administered 2020-05-18 – 2020-05-19 (×4): 1 mg via INTRAVENOUS
  Filled 2020-05-18 (×3): qty 1

## 2020-05-18 MED ORDER — MIDAZOLAM HCL 2 MG/2ML IJ SOLN
1.0000 mg | INTRAMUSCULAR | Status: AC
  Administered 2020-05-18: 2 mg via INTRAVENOUS
  Filled 2020-05-18: qty 2

## 2020-05-18 MED ORDER — CEFAZOLIN SODIUM-DEXTROSE 1-4 GM/50ML-% IV SOLN
1.0000 g | Freq: Four times a day (QID) | INTRAVENOUS | Status: AC
  Administered 2020-05-19: 1 g via INTRAVENOUS
  Filled 2020-05-18: qty 50

## 2020-05-18 MED ORDER — FENTANYL CITRATE (PF) 100 MCG/2ML IJ SOLN
INTRAMUSCULAR | Status: AC
  Administered 2020-05-18: 25 ug via INTRAVENOUS
  Filled 2020-05-18: qty 2

## 2020-05-18 MED ORDER — ALUM & MAG HYDROXIDE-SIMETH 200-200-20 MG/5ML PO SUSP
30.0000 mL | ORAL | Status: DC | PRN
  Filled 2020-05-18: qty 30

## 2020-05-18 MED ORDER — ASPIRIN 81 MG PO CHEW
81.0000 mg | CHEWABLE_TABLET | Freq: Two times a day (BID) | ORAL | Status: DC
  Administered 2020-05-18 – 2020-05-20 (×4): 81 mg via ORAL
  Filled 2020-05-18 (×4): qty 1

## 2020-05-18 MED ORDER — CEFAZOLIN SODIUM-DEXTROSE 1-4 GM/50ML-% IV SOLN
INTRAVENOUS | Status: AC
  Administered 2020-05-18: 1 g via INTRAVENOUS
  Filled 2020-05-18: qty 50

## 2020-05-18 MED ORDER — METHOCARBAMOL 500 MG IVPB - SIMPLE MED
500.0000 mg | Freq: Four times a day (QID) | INTRAVENOUS | Status: DC | PRN
  Administered 2020-05-18: 500 mg via INTRAVENOUS
  Filled 2020-05-18: qty 50

## 2020-05-18 MED ORDER — ASPIRIN 81 MG PO CHEW: 81.0000 mg | CHEWABLE_TABLET | Freq: Two times a day (BID) | ORAL | 0 refills | Status: DC

## 2020-05-18 MED ORDER — HYDROMORPHONE HCL 1 MG/ML IJ SOLN
0.5000 mg | INTRAMUSCULAR | Status: AC | PRN
  Administered 2020-05-18: 0.5 mg via INTRAVENOUS

## 2020-05-18 MED ORDER — FENTANYL CITRATE (PF) 100 MCG/2ML IJ SOLN
INTRAMUSCULAR | Status: AC
  Administered 2020-05-18: 50 ug via INTRAVENOUS
  Filled 2020-05-18: qty 2

## 2020-05-18 MED ORDER — VITAMIN D 25 MCG (1000 UNIT) PO TABS
2000.0000 [IU] | ORAL_TABLET | Freq: Every day | ORAL | Status: DC
  Administered 2020-05-18 – 2020-05-20 (×3): 2000 [IU] via ORAL
  Filled 2020-05-18 (×3): qty 2

## 2020-05-18 MED ORDER — ROPIVACAINE HCL 7.5 MG/ML IJ SOLN
INTRAMUSCULAR | Status: DC | PRN
  Administered 2020-05-18: 25 mL via PERINEURAL

## 2020-05-18 MED ORDER — OXYCODONE HCL 5 MG PO TABS
ORAL_TABLET | ORAL | Status: AC
  Administered 2020-05-18: 10 mg via ORAL
  Filled 2020-05-18: qty 2

## 2020-05-18 MED ORDER — LACTATED RINGERS IV SOLN: INTRAVENOUS | Status: DC | PRN

## 2020-05-18 MED ORDER — PROPOFOL 10 MG/ML IV BOLUS
INTRAVENOUS | Status: DC | PRN
  Administered 2020-05-18: 20 mg via INTRAVENOUS
  Administered 2020-05-18: 30 mg via INTRAVENOUS

## 2020-05-18 MED ORDER — BACLOFEN 10 MG PO TABS
10.0000 mg | ORAL_TABLET | Freq: Two times a day (BID) | ORAL | Status: DC | PRN
  Administered 2020-05-19 (×3): 10 mg via ORAL
  Filled 2020-05-18 (×4): qty 1

## 2020-05-18 MED ORDER — VITAMIN D 50 MCG (2000 UT) PO TABS: 2000.0000 [IU] | ORAL_TABLET | Freq: Every day | ORAL | Status: DC

## 2020-05-18 MED ORDER — BACLOFEN 10 MG PO TABS: 10.0000 mg | ORAL_TABLET | Freq: Three times a day (TID) | ORAL | 0 refills | Status: DC | PRN

## 2020-05-18 MED ORDER — OXYCODONE HCL 5 MG PO TABS
ORAL_TABLET | ORAL | Status: AC
  Filled 2020-05-18: qty 1

## 2020-05-18 MED ORDER — CHLORHEXIDINE GLUCONATE 0.12 % MT SOLN
15.0000 mL | Freq: Once | OROMUCOSAL | Status: AC
  Administered 2020-05-18: 15 mL via OROMUCOSAL

## 2020-05-18 MED ORDER — DEXAMETHASONE SODIUM PHOSPHATE 10 MG/ML IJ SOLN
INTRAMUSCULAR | Status: DC | PRN
  Administered 2020-05-18: 10 mg via INTRAVENOUS

## 2020-05-18 MED ORDER — ONDANSETRON HCL 4 MG/2ML IJ SOLN
INTRAMUSCULAR | Status: DC | PRN
  Administered 2020-05-18: 4 mg via INTRAVENOUS

## 2020-05-18 MED ORDER — ONDANSETRON HCL 4 MG PO TABS
4.0000 mg | ORAL_TABLET | Freq: Four times a day (QID) | ORAL | Status: DC | PRN
  Filled 2020-05-18: qty 1

## 2020-05-18 MED ORDER — FENTANYL CITRATE (PF) 100 MCG/2ML IJ SOLN
25.0000 ug | INTRAMUSCULAR | Status: DC | PRN
Start: 2020-05-18 — End: 2020-05-19
  Administered 2020-05-18: 25 ug via INTRAVENOUS
  Administered 2020-05-18: 50 ug via INTRAVENOUS

## 2020-05-18 MED ORDER — OXYCODONE HCL 5 MG PO TABS
ORAL_TABLET | ORAL | Status: AC
  Filled 2020-05-18: qty 3

## 2020-05-18 MED ORDER — PROPOFOL 10 MG/ML IV BOLUS
INTRAVENOUS | Status: DC | PRN
  Administered 2020-05-18: 75 ug/kg/min via INTRAVENOUS

## 2020-05-18 MED ORDER — ONDANSETRON HCL 4 MG/2ML IJ SOLN: 4.0000 mg | Freq: Four times a day (QID) | INTRAMUSCULAR | Status: DC | PRN

## 2020-05-18 MED ORDER — LACTATED RINGERS IV SOLN: INTRAVENOUS | Status: DC

## 2020-05-18 MED ORDER — HYDROMORPHONE HCL 1 MG/ML IJ SOLN
INTRAMUSCULAR | Status: AC
  Administered 2020-05-18: 0.5 mg via INTRAVENOUS
  Filled 2020-05-18: qty 1

## 2020-05-18 MED ORDER — GABAPENTIN 100 MG PO CAPS
100.0000 mg | ORAL_CAPSULE | Freq: Three times a day (TID) | ORAL | Status: DC
  Administered 2020-05-18 – 2020-05-20 (×5): 100 mg via ORAL
  Filled 2020-05-18 (×5): qty 1

## 2020-05-18 MED ORDER — MEPERIDINE HCL 50 MG/ML IJ SOLN: 6.2500 mg | INTRAMUSCULAR | Status: DC | PRN

## 2020-05-18 MED ORDER — ACETAMINOPHEN 325 MG PO TABS
325.0000 mg | ORAL_TABLET | Freq: Four times a day (QID) | ORAL | Status: DC | PRN
  Administered 2020-05-19: 650 mg via ORAL
  Filled 2020-05-18: qty 2

## 2020-05-18 MED ORDER — METOCLOPRAMIDE HCL 5 MG PO TABS
5.0000 mg | ORAL_TABLET | Freq: Three times a day (TID) | ORAL | Status: DC | PRN
  Filled 2020-05-18: qty 2

## 2020-05-18 MED ORDER — ORAL CARE MOUTH RINSE: 15.0000 mL | Freq: Once | OROMUCOSAL | Status: AC

## 2020-05-18 MED ORDER — METHOCARBAMOL 500 MG IVPB - SIMPLE MED
INTRAVENOUS | Status: AC
  Filled 2020-05-18: qty 50

## 2020-05-18 MED ORDER — TRANEXAMIC ACID-NACL 1000-0.7 MG/100ML-% IV SOLN
INTRAVENOUS | Status: AC
  Filled 2020-05-18: qty 100

## 2020-05-18 MED ORDER — OXYCODONE HCL 5 MG PO TABS
10.0000 mg | ORAL_TABLET | ORAL | Status: DC | PRN
  Administered 2020-05-18 – 2020-05-19 (×2): 15 mg via ORAL
  Filled 2020-05-18: qty 3

## 2020-05-18 MED ORDER — ACETAMINOPHEN 325 MG PO TABS
ORAL_TABLET | ORAL | Status: AC
  Filled 2020-05-18: qty 2

## 2020-05-18 MED ORDER — ACETAMINOPHEN 160 MG/5ML PO SOLN: 325.0000 mg | ORAL | Status: DC | PRN

## 2020-05-18 MED ORDER — CEFAZOLIN SODIUM-DEXTROSE 2-4 GM/100ML-% IV SOLN
2.0000 g | Freq: Once | INTRAVENOUS | Status: AC
  Administered 2020-05-18: 2 g via INTRAVENOUS
  Filled 2020-05-18: qty 100

## 2020-05-18 MED ORDER — ACETAMINOPHEN 325 MG PO TABS
325.0000 mg | ORAL_TABLET | ORAL | Status: DC | PRN
Start: 2020-05-18 — End: 2020-05-19
  Administered 2020-05-18: 650 mg via ORAL

## 2020-05-18 MED ORDER — 0.9 % SODIUM CHLORIDE (POUR BTL) OPTIME
TOPICAL | Status: DC | PRN
  Administered 2020-05-18: 1000 mL

## 2020-05-18 MED ORDER — DOCUSATE SODIUM 100 MG PO CAPS
100.0000 mg | ORAL_CAPSULE | Freq: Two times a day (BID) | ORAL | Status: DC
  Administered 2020-05-18 – 2020-05-20 (×4): 100 mg via ORAL
  Filled 2020-05-18 (×4): qty 1

## 2020-05-18 MED ORDER — PHENOL 1.4 % MT LIQD: 1.0000 | OROMUCOSAL | Status: DC | PRN

## 2020-05-18 SURGICAL SUPPLY — 59 items
APL SKNCLS STERI-STRIP NONHPOA (GAUZE/BANDAGES/DRESSINGS)
BAG SPEC THK2 15X12 ZIP CLS (MISCELLANEOUS)
BAG ZIPLOCK 12X15 (MISCELLANEOUS) IMPLANT
BENZOIN TINCTURE PRP APPL 2/3 (GAUZE/BANDAGES/DRESSINGS) IMPLANT
BLADE SAG 18X100X1.27 (BLADE) IMPLANT
BLADE SURG SZ10 CARB STEEL (BLADE) ×4 IMPLANT
BNDG CMPR MED 10X6 ELC LF (GAUZE/BANDAGES/DRESSINGS) ×1
BNDG ELASTIC 6X10 VLCR STRL LF (GAUZE/BANDAGES/DRESSINGS) ×1 IMPLANT
BNDG ELASTIC 6X5.8 VLCR STR LF (GAUZE/BANDAGES/DRESSINGS) ×2 IMPLANT
BOWL SMART MIX CTS (DISPOSABLE) IMPLANT
BSPLAT TIB 3 KN TRITANIUM (Knees) ×1 IMPLANT
COVER SURGICAL LIGHT HANDLE (MISCELLANEOUS) ×2 IMPLANT
COVER WAND RF STERILE (DRAPES) IMPLANT
CUFF TOURN SGL QUICK 34 (TOURNIQUET CUFF) ×2
CUFF TRNQT CYL 34X4.125X (TOURNIQUET CUFF) ×1 IMPLANT
DECANTER SPIKE VIAL GLASS SM (MISCELLANEOUS) IMPLANT
DRAPE U-SHAPE 47X51 STRL (DRAPES) ×2 IMPLANT
DRSG PAD ABDOMINAL 8X10 ST (GAUZE/BANDAGES/DRESSINGS) ×3 IMPLANT
DURAPREP 26ML APPLICATOR (WOUND CARE) ×2 IMPLANT
ELECT BLADE TIP CTD 4 INCH (ELECTRODE) ×2 IMPLANT
ELECT REM PT RETURN 15FT ADLT (MISCELLANEOUS) ×2 IMPLANT
FEMORAL POSTERIOR SZ4 RT (Femur) IMPLANT
GAUZE SPONGE 4X4 12PLY STRL (GAUZE/BANDAGES/DRESSINGS) ×2 IMPLANT
GAUZE XEROFORM 1X8 LF (GAUZE/BANDAGES/DRESSINGS) ×1 IMPLANT
GLOVE BIO SURGEON STRL SZ7.5 (GLOVE) ×2 IMPLANT
GLOVE ECLIPSE 8.0 STRL XLNG CF (GLOVE) ×2 IMPLANT
GLOVE SRG 8 PF TXTR STRL LF DI (GLOVE) ×2 IMPLANT
GLOVE SURG UNDER POLY LF SZ8 (GLOVE) ×4
GOWN STRL REUS W/TWL XL LVL3 (GOWN DISPOSABLE) ×4 IMPLANT
HANDPIECE INTERPULSE COAX TIP (DISPOSABLE) ×2
HOLDER FOLEY CATH W/STRAP (MISCELLANEOUS) IMPLANT
IMMOBILIZER KNEE 20 (SOFTGOODS) ×2
IMMOBILIZER KNEE 20 THIGH 36 (SOFTGOODS) ×1 IMPLANT
INSERT PS TRIATH X3 #3 10 (Insert) ×2 IMPLANT
KIT TURNOVER KIT A (KITS) IMPLANT
KNEE PATELLA ASYMMETRIC 9X29 (Knees) ×1 IMPLANT
KNEE TIBIAL COMPONENT SZ3 (Knees) ×1 IMPLANT
NS IRRIG 1000ML POUR BTL (IV SOLUTION) ×2 IMPLANT
PACK TOTAL KNEE CUSTOM (KITS) ×2 IMPLANT
PAD CAST 4YDX4 CTTN HI CHSV (CAST SUPPLIES) ×1 IMPLANT
PADDING CAST COTTON 4X4 STRL (CAST SUPPLIES) ×2
PADDING CAST COTTON 6X4 STRL (CAST SUPPLIES) ×4 IMPLANT
PENCIL SMOKE EVACUATOR (MISCELLANEOUS) IMPLANT
PIN FLUTED HEDLESS FIX 3.5X1/8 (PIN) ×1 IMPLANT
POSTERIOR FEMORAL SZ4 RT (Femur) ×2 IMPLANT
PROTECTOR NERVE ULNAR (MISCELLANEOUS) ×2 IMPLANT
SET HNDPC FAN SPRY TIP SCT (DISPOSABLE) ×1 IMPLANT
SET PAD KNEE POSITIONER (MISCELLANEOUS) ×2 IMPLANT
STAPLER VISISTAT 35W (STAPLE) IMPLANT
STRIP CLOSURE SKIN 1/2X4 (GAUZE/BANDAGES/DRESSINGS) IMPLANT
SUT MNCRL AB 4-0 PS2 18 (SUTURE) IMPLANT
SUT VIC AB 0 CT1 27 (SUTURE) ×2
SUT VIC AB 0 CT1 27XBRD ANTBC (SUTURE) ×1 IMPLANT
SUT VIC AB 1 CT1 36 (SUTURE) ×4 IMPLANT
SUT VIC AB 2-0 CT1 27 (SUTURE) ×4
SUT VIC AB 2-0 CT1 TAPERPNT 27 (SUTURE) ×2 IMPLANT
TRAY FOLEY MTR SLVR 16FR STAT (SET/KITS/TRAYS/PACK) ×2 IMPLANT
WATER STERILE IRR 1000ML POUR (IV SOLUTION) ×2 IMPLANT
WRAP KNEE MAXI GEL POST OP (GAUZE/BANDAGES/DRESSINGS) ×1 IMPLANT

## 2020-05-18 NOTE — Brief Op Note (Signed)
05/18/2020  3:46 PM  PATIENT:  [name]  71 y.o. female  PRE-OPERATIVE DIAGNOSIS:  right knee osteoarthritis  POST-OPERATIVE DIAGNOSIS:  right knee osteoarthritis  PROCEDURE:  Procedure(s): RIGHT TOTAL KNEE ARTHROPLASTY (Right)  SURGEON:  Surgeon(s) and Role:    Kathryne Hitch, MD - Primary  PHYSICIAN ASSISTANT:  Rexene Edison, PA-C  ANESTHESIA:   local, regional and spinal  EBL:  25 mL   COUNTS:  YES  TOURNIQUET:   Total Tourniquet Time Documented: Thigh (Right) - 44 minutes Total: Thigh (Right) - 44 minutes   DICTATION: .Other Dictation: Dictation Number [id number]  PLAN OF CARE: Admit for overnight observation  PATIENT DISPOSITION:  PACU - hemodynamically stable.   Delay start of Pharmacological VTE agent (>24hrs) due to surgical blood loss or risk of bleeding: no

## 2020-05-18 NOTE — Progress Notes (Signed)
AssistedDr. Oddono with right, ultrasound guided, adductor canal block. Side rails up, monitors on throughout procedure. See vital signs in flow sheet. Tolerated Procedure well.  

## 2020-05-18 NOTE — Anesthesia Postprocedure Evaluation (Signed)
Anesthesia Post Note  Patient: [name]  Procedure(s) Performed: RIGHT TOTAL KNEE ARTHROPLASTY (Right Knee)     Patient location during evaluation: PACU Anesthesia Type: Spinal Level of consciousness: oriented and awake and alert Pain management: pain level controlled Vital Signs Assessment: post-procedure vital signs reviewed and stable Respiratory status: spontaneous breathing, respiratory function stable and patient connected to nasal cannula oxygen Cardiovascular status: blood pressure returned to baseline and stable Postop Assessment: no headache, no backache and no apparent nausea or vomiting Anesthetic complications: no   No complications documented.  Last Vitals:  Vitals:   05/18/20 1621 05/18/20 1627  BP:    Pulse:  68  Resp:  16  Temp:    SpO2: 95% 100%    Last Pain:  Vitals:   05/18/20 1647  TempSrc:   PainSc: 9                  Jodelle Fausto S

## 2020-05-18 NOTE — Op Note (Signed)
[name], [name] MEDICAL RECORD [id number] ACCOUNT [id number] DATE OF BIRTH:[birth year] FACILITY: [facility] LOCATION: [facility] PHYSICIAN:Laddie Naeem Aretha Parrot, MD  OPERATIVE REPORT  DATE OF PROCEDURE:  05/18/2020  PREOPERATIVE DIAGNOSES:  Primary osteoarthritis and degenerative joint disease, right knee.  POSTOPERATIVE DIAGNOSES:  Primary osteoarthritis and degenerative joint disease, right knee.  PROCEDURE:  Right total knee arthroplasty.  IMPLANTS:  Stryker Triathlon press-fit knee system with size 4 femur, size 3 tibial tray, 10 mm fixed bearing polyethylene insert, size 29 patellar button.  SURGEON:  Vanita Panda. Magnus Ivan, MD  ASSISTANT:  Richardean Canal, PA-C  ANESTHESIA: 1.  Right lower extremity adductor canal block. 2.  Spinal. 3.  Local with 0.5% Marcaine with epinephrine around the arthrotomy.  TOURNIQUET TIME:  Under 1 hour.  ANTIBIOTICS:  Two grams IV Ancef.  BLOOD LOSS:  Less than 100 mL.  COMPLICATIONS:  None.  INDICATIONS:  The patient is a 70 year old female with debilitating arthritis involving both of her knees.  We actually replaced her left knee under 6 months ago.  Her right knee has debilitating arthritis and given the good outcome she has had with her  left knee, she wished to have her right knee replaced.  I agree with this given the severe arthritis of her right knee seen on clinical exam and x-ray findings as well.  Having had this done before, she is fully aware of the risk of acute blood loss  anemia, nerve or vessel injury, fracture, infection, DVT, and implant failure.  She understands our goals are to decrease pain, improve mobility and overall improve quality of life.  DESCRIPTION OF PROCEDURE:  After informed consent was obtained, appropriate right knee was marked and adductor canal block was obtained in the right lower extremity in the holding room.  She was then brought to the operating room and sat up on the  operating table where  spinal anesthesia was obtained.  She was laid in the supine position on the operating table.  Foley catheter was placed and a nonsterile tourniquet was placed around her upper right thigh.  Her right thigh, knee, leg, ankle and foot  were prepped and draped with DuraPrep and sterile drapes including a sterile stockinette.  An Esmarch was used to wrap that leg and tourniquet was inflated to 300 mm of pressure.  Before this, a timeout was called and she was identified as correct  patient, correct right knee.  I then made an incision over the patella and carried this proximally and distally.  I dissected down the knee joint and carried out a medial parapatellar arthrotomy, finding a moderate joint effusion.  We then put the knee  in a flexed position, removed remnants of ACL, PCL, medial and lateral meniscus as well as periarticular osteophytes around the knee in all 3 compartments.  We then used our extramedullary cutting guide for making our proximal tibia cut, setting this for  taking 2 mm off the high side and correcting for varus and valgus and neutral slope.  I made this cut without difficulty and decided to go ahead and back it down 2 more millimeters, which we did.  We then used an intramedullary cutting guide for the  femur for setting this for a right knee at 5 degrees externally rotated for a 10 mm distal femoral cut.  We made that cut without difficulty.  We did both of these cuts because she has a slight flexion contracture.  With the knee back down to full  extension, we put a  9 mm extension block and she had full extension.  We then went back to the femur and put our femoral sizing guide based off the epicondylar axis and Whiteside's line.  Based off of this, we actually chose a size 4 femur.  We put a  4-in-1 cutting block for a size 4 femur, made our anterior and posterior cuts, followed by our chamfer cuts.  We then made our femoral box cut for a size 4.  Attention was then turned back to the  tibia.  We chose a size 3 tibial tray for coverage over  the tibial plateau and set the rotation off the tibial tubercle and the femur.  We then made our keel punch off of this.  With a size 3 trial tibia, we trialed a size 4 right femur trial.  We tried a 9 mm fixed bearing polyethylene insert.  She had  excellent range of motion and stability with that, and we felt we going up 1 mm for the final would be appropriate for a 10 mm insert.  We then made our patellar cut and drilled 3 holes for a size 29 press-fit patellar button.  I put the knee through  several cycles of motion and then removed all instrumentation from the knee.  We irrigated the knee with normal saline solution using pulsatile lavage and then dried the knee real well.  We placed our Marcaine with epinephrine around the arthrotomy and  dried the knee again.  With the knee in a flexed position, we then placed our real Stryker press-fit tibial tray size 3 followed by real size 4 right press-fit femur.  We placed our real 10 mm fixed bearing polyethylene insert and press-fit our size 29  patellar button.  We were pleased with stability and range of motion of the knee after that and the contact stresses.  We then let our tourniquet down and hemostasis obtained with electrocautery.  We irrigated the knee with normal saline solution and  then closed the arthrotomy with interrupted #1 Vicryl suture followed by 0 Vicryl to close the deep tissue, 2-0 Vicryl to close the subcutaneous tissue.  The skin was closed with staples.  Xeroform and well-padded sterile dressing was applied.  She was  taken to recovery room in stable condition with all final counts being correct and no complications noted.  Of note, Rexene Edison, PA-C, assisted during the entire case and his assistance was crucial for facilitating all aspects of this case.  HN/NUANCE  D:05/18/2020 T:05/18/2020 JOB:[id number]

## 2020-05-18 NOTE — Anesthesia Preprocedure Evaluation (Addendum)
Anesthesia Evaluation  Patient identified by MRN, date of birth, ID band Patient awake    Reviewed: Allergy & Precautions, NPO status , Patient's Chart, lab work & pertinent test results  Airway Mallampati: II  TM Distance: >3 FB Neck ROM: Full    Dental no notable dental hx. (+) Teeth Intact, Dental Advisory Given   Pulmonary COPD,  COPD inhaler, Current Smoker and Patient abstained from smoking.,  Current smoker, 20 pack year history- 4 cigg/d Nebulizer PRN- last used about 6 mo ago   Pulmonary exam normal breath sounds clear to auscultation       Cardiovascular negative cardio ROS Normal cardiovascular exam Rhythm:Regular Rate:Normal     Neuro/Psych PSYCHIATRIC DISORDERS Anxiety Depression Dementia negative neurological ROS     GI/Hepatic Neg liver ROS, hiatal hernia, GERD  Medicated and Controlled,  Endo/Other  Obesity BMI 33  Renal/GU negative Renal ROS  negative genitourinary   Musculoskeletal  (+) Arthritis , Osteoarthritis,    Abdominal   Peds  Hematology hct 41.9, plt clumped on initial CBC- will redraw this AM   Anesthesia Other Findings   Reproductive/Obstetrics negative OB ROS                             Anesthesia Physical  Anesthesia Plan  ASA: II  Anesthesia Plan: MAC and Spinal   Post-op Pain Management:  Regional for Post-op pain   Induction:   PONV Risk Score and Plan: 2 and Treatment may vary due to age or medical condition, Midazolam and Propofol infusion  Airway Management Planned: Nasal Cannula, Natural Airway, Simple Face Mask and Mask  Additional Equipment: None  Intra-op Plan:   Post-operative Plan: Extubation in OR  Informed Consent: I have reviewed the patients History and Physical, chart, labs and discussed the procedure including the risks, benefits and alternatives for the proposed anesthesia with the patient or authorized representative who has  indicated his/her understanding and acceptance.     Dental advisory given  Plan Discussed with: CRNA and Anesthesiologist  Anesthesia Plan Comments:        Anesthesia Quick Evaluation

## 2020-05-18 NOTE — Anesthesia Procedure Notes (Addendum)
Anesthesia Regional Block: Adductor canal block   Pre-Anesthetic Checklist: ,, timeout performed, Correct Patient, Correct Site, Correct Laterality, Correct Procedure, Correct Position, site marked, Risks and benefits discussed,  Surgical consent,  Pre-op evaluation,  At surgeon's request and post-op pain management  Laterality: Right  Prep: chloraprep       Needles:  Injection technique: Single-shot  Needle Type: Echogenic Stimulator Needle     Needle Length: 5cm  Needle Gauge: 22     Additional Needles:   Procedures:, nerve stimulator,,, ultrasound used (permanent image in chart),,,,  Narrative:  Start time: 05/18/2020 1:27 PM End time: 05/18/2020 1:32 PM Injection made incrementally with aspirations every 5 mL.  Performed by: Personally  Anesthesiologist: Bethena Midget, MD  Additional Notes: Functioning IV was confirmed and monitors were applied.  A 71mm 22ga Arrow echogenic stimulator needle was used. Sterile prep and drape,hand hygiene and sterile gloves were used. Ultrasound guidance: relevant anatomy identified, needle position confirmed, local anesthetic spread visualized around nerve(s)., vascular puncture avoided.  Image printed for medical record. Negative aspiration and negative test dose prior to incremental administration of local anesthetic. The patient tolerated the procedure well.

## 2020-05-18 NOTE — Anesthesia Procedure Notes (Signed)
Spinal  Patient location during procedure: OR Start time: 05/18/2020 2:19 PM End time: 05/18/2020 2:22 PM Staffing Anesthesiologist: Bethena Midget, MD Preanesthetic Checklist Completed: patient identified, IV checked, site marked, risks and benefits discussed, surgical consent, monitors and equipment checked, pre-op evaluation and timeout performed Spinal Block Patient position: sitting Prep: DuraPrep Patient monitoring: heart rate, cardiac monitor, continuous pulse ox and blood pressure Approach: midline Location: L3-4 Injection technique: single-shot Needle Needle type: Sprotte  Needle gauge: 24 G Needle length: 9 cm Assessment Sensory level: T4

## 2020-05-18 NOTE — Discharge Instructions (Signed)

## 2020-05-18 NOTE — Transfer of Care (Signed)
Immediate Anesthesia Transfer of Care Note  Patient: [name]  Procedure(s) Performed: RIGHT TOTAL KNEE ARTHROPLASTY (Right Knee)  Patient Location: PACU  Anesthesia Type:Spinal  Level of Consciousness: awake, alert  and oriented  Airway & Oxygen Therapy: Patient Spontanous Breathing  Post-op Assessment: Report given to RN and Post -op Vital signs reviewed and stable  Post vital signs: Reviewed and stable  Last Vitals:  Vitals Value Taken Time  BP    Temp    Pulse 72 05/18/20 1604  Resp 16 05/18/20 1604  SpO2 94 % 05/18/20 1604  Vitals shown include unvalidated device data.  Last Pain:  Vitals:   05/18/20 1237  TempSrc:   PainSc: 3       Patients Stated Pain Goal: 2 (05/18/20 1237)  Complications: No complications documented.

## 2020-05-18 NOTE — H&P (Signed)
TOTAL KNEE ADMISSION H&P  Patient is being admitted for right total knee arthroplasty.  Subjective:  Chief Complaint:right knee pain.  HPI: [name], 70 y.o. female, has a history of pain and functional disability in the right knee due to arthritis and has failed non-surgical conservative treatments for greater than 12 weeks to includeNSAID's and/or analgesics, corticosteriod injections, viscosupplementation injections, flexibility and strengthening excercises, supervised PT with diminished ADL's post treatment, use of assistive devices and activity modification.  Onset of symptoms was gradual, starting 3 years ago with gradually worsening course since that time. The patient noted no past surgery on the right knee(s).  Patient currently rates pain in the right knee(s) at 10 out of 10 with activity. Patient has night pain, worsening of pain with activity and weight bearing, pain that interferes with activities of daily living, pain with passive range of motion, crepitus and joint swelling.  Patient has evidence of subchondral sclerosis, periarticular osteophytes and joint space narrowing by imaging studies. There is no active infection.  Patient Active Problem List   Diagnosis Date Noted  . 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 Unilateral primary osteoarthritis, left knee 10/29/2019  . Unilateral primary osteoarthritis, right knee 10/29/2019   Past Medical History:  Diagnosis Date  . Anxiety   . Arthritis   . COPD (chronic obstructive pulmonary disease) (HCC)   . Depression   . GERD (gastroesophageal reflux disease)   . History of hiatal hernia   . RLS (restless legs syndrome)     Past Surgical History:  Procedure Laterality Date  . BREAST SURGERY     lumpectomy  . CHOLECYSTECTOMY    . JOINT REPLACEMENT    . TONSILLECTOMY    . TOTAL KNEE ARTHROPLASTY Left 12/16/2019   Procedure: LEFT TOTAL KNEE ARTHROPLASTY;  Surgeon: Kathryne Hitch, MD;  Location: [facility]  OR;  Service: Orthopedics;  Laterality: Left;    Current Facility-Administered Medications  Medication Dose Route Frequency Provider Last Rate Last Admin  . ceFAZolin (ANCEF) IVPB 2g/100 mL premix  2 g Intravenous Once Kathryne Hitch, MD      . fentaNYL (SUBLIMAZE) injection 50-100 mcg  50-100 mcg Intravenous UD Kathryne Hitch, MD      . lactated ringers infusion   Intravenous Continuous Kathryne Hitch, MD 10 mL/hr at 05/18/20 1256 New Bag at 05/18/20 1256  . midazolam (VERSED) injection 1-2 mg  1-2 mg Intravenous UD Kathryne Hitch, MD       No Known Allergies  Social History   Tobacco Use  . Smoking status: Current Every Day Smoker    Packs/day: 0.50    Years: 40.00    Pack years: 20.00  . Smokeless tobacco: Never Used  Substance Use Topics  . Alcohol use: Never    History reviewed. No pertinent family history.   Review of Systems  Musculoskeletal: Positive for joint swelling.  All other systems reviewed and are negative.   Objective:  Physical Exam Vitals reviewed.  Constitutional:      Appearance: Normal appearance.  HENT:     Head: Normocephalic and atraumatic.  Eyes:     Extraocular Movements: Extraocular movements intact.     Pupils: Pupils are equal, round, and reactive to light.  Cardiovascular:     Rate and Rhythm: Normal rate.     Pulses: Normal pulses.  Pulmonary:     Effort: Pulmonary effort is normal.  Abdominal:     Palpations: Abdomen is soft.  Musculoskeletal:  Cervical back: Normal range of motion.     Right knee: Effusion and bony tenderness present. Decreased range of motion. Tenderness present over the medial joint line, lateral joint line and patellar tendon. Abnormal alignment.  Neurological:     Mental Status: She is alert and oriented to person, place, and time.  Psychiatric:        Behavior: Behavior normal.     Vital signs in last 24 hours: Temp:  [98.8 F (37.1 C)] 98.8 F (37.1 C) (01/11  1210) Pulse Rate:  [96] 96 (01/11 1210) Resp:  [18] 18 (01/11 1210) SpO2:  [97 %] 97 % (01/11 1210) Weight:  [85.8 kg] 85.8 kg (01/11 1210)  Labs:   Estimated body mass index is 29.63 kg/m as calculated from the following:   Height as of this encounter: 5\' 7"  (1.702 m).   Weight as of this encounter: 85.8 kg.   Imaging Review Plain radiographs demonstrate severe degenerative joint disease of the right knee(s). The overall alignment ismild varus. The bone quality appears to be good for age and reported activity level.      Assessment/Plan:  End stage arthritis, right knee   The patient history, physical examination, clinical judgment of the provider and imaging studies are consistent with end stage degenerative joint disease of the right knee(s) and total knee arthroplasty is deemed medically necessary. The treatment options including medical management, injection therapy arthroscopy and arthroplasty were discussed at length. The risks and benefits of total knee arthroplasty were presented and reviewed. The risks due to aseptic loosening, infection, stiffness, patella tracking problems, thromboembolic complications and other imponderables were discussed. The patient acknowledged the explanation, agreed to proceed with the plan and consent was signed. Patient is being admitted for inpatient treatment for surgery, pain control, PT, OT, prophylactic antibiotics, VTE prophylaxis, progressive ambulation and ADL's and discharge planning. The patient is planning to be discharged home with home health services

## 2020-05-19 ENCOUNTER — Other Ambulatory Visit: Payer: Self-pay

## 2020-05-19 ENCOUNTER — Telehealth: Payer: Self-pay

## 2020-05-19 DIAGNOSIS — M1711 Unilateral primary osteoarthritis, right knee: Secondary | ICD-10-CM | POA: Diagnosis not present

## 2020-05-19 LAB — BASIC METABOLIC PANEL
Anion gap: 8 (ref 5–15)
BUN: 14 mg/dL (ref 8–23)
CO2: 29 mmol/L (ref 22–32)
Calcium: 9.6 mg/dL (ref 8.9–10.3)
Chloride: 106 mmol/L (ref 98–111)
Creatinine, Ser: 0.67 mg/dL (ref 0.44–1.00)
GFR, Estimated: >60 mL/min (ref >60)
Glucose, Bld: 130 mg/dL — ABNORMAL HIGH (ref 70–99)
Potassium: 4.9 mmol/L (ref 3.5–5.1)
Sodium: 143 mmol/L (ref 135–145)

## 2020-05-19 LAB — CBC
HCT: 36.4 % (ref 36.0–46.0)
Hemoglobin: 11.8 g/dL — ABNORMAL LOW (ref 12.0–15.0)
MCH: 29.7 pg (ref 26.0–34.0)
MCHC: 32.4 g/dL (ref 30.0–36.0)
MCV: 91.7 fL (ref 80.0–100.0)
Platelets: 148 10*3/uL — ABNORMAL LOW (ref 150–400)
RBC: 3.97 MIL/uL (ref 3.87–5.11)
RDW: 13.9 % (ref 11.5–15.5)
WBC: 14.3 10*3/uL — ABNORMAL HIGH (ref 4.0–10.5)
nRBC: 0 % (ref 0.0–0.2)

## 2020-05-19 MED ORDER — GABAPENTIN 100 MG PO CAPS: 100.0000 mg | ORAL_CAPSULE | Freq: Three times a day (TID) | ORAL | 1 refills | Status: DC

## 2020-05-19 MED ORDER — HYDROCODONE-ACETAMINOPHEN 7.5-325 MG PO TABS
1.0000 | ORAL_TABLET | ORAL | Status: DC | PRN
  Administered 2020-05-19 – 2020-05-20 (×6): 2 via ORAL
  Filled 2020-05-19 (×6): qty 2

## 2020-05-19 MED ORDER — HYDROCODONE-ACETAMINOPHEN 7.5-325 MG PO TABS: 1.0000 | ORAL_TABLET | ORAL | 0 refills | Status: DC | PRN

## 2020-05-19 NOTE — Discharge Summary (Signed)
Patient ID: [name] MRN: [id number] DOB/AGE: [birth year] 70 y.o.  Admit date: 05/18/2020 Discharge date: 05/19/2020  Admission Diagnoses:  Principal Problem:   Unilateral primary osteoarthritis, right knee Active Problems:   Status post total right knee replacement   Discharge Diagnoses:  Same  Past Medical History:  Diagnosis Date  . Anxiety   . Arthritis   . COPD (chronic obstructive pulmonary disease) (HCC)   . Depression   . GERD (gastroesophageal reflux disease)   . History of hiatal hernia   . RLS (restless legs syndrome)     Surgeries: Procedure(s): RIGHT TOTAL KNEE ARTHROPLASTY on 05/18/2020   Consultants:   Discharged Condition: Improved  Hospital Course: Clemencia Helzer is an 70 y.o. female who was admitted 05/18/2020 for operative treatment ofUnilateral primary osteoarthritis, right knee. Patient has severe unremitting pain that affects sleep, daily activities, and work/hobbies. After pre-op clearance the patient was taken to the operating room on 05/18/2020 and underwent  Procedure(s): RIGHT TOTAL KNEE ARTHROPLASTY.    Patient was given perioperative antibiotics:  Anti-infectives (From admission, onward)   Start     Dose/Rate Route Frequency Ordered Stop   05/18/20 2030  ceFAZolin (ANCEF) IVPB 1 g/50 mL premix        1 g 100 mL/hr over 30 Minutes Intravenous Every 6 hours 05/18/20 1727 05/19/20 0234   05/18/20 1215  ceFAZolin (ANCEF) IVPB 2g/100 mL premix        2 g 200 mL/hr over 30 Minutes Intravenous  Once 05/18/20 1214 05/18/20 1415       Patient was given sequential compression devices, early ambulation, and chemoprophylaxis to prevent DVT.  Patient benefited maximally from hospital stay and there were no complications.    Recent vital signs:  Patient Vitals for the past 24 hrs:  BP Temp Temp src Pulse Resp SpO2 Height Weight  05/19/20 0614 135/65 98.2 F (36.8 C) Oral 79 16 98 % -- --  05/19/20 0134 134/75 98.4 F (36.9 C) Oral 82  16 98 % -- --  05/19/20 0100 136/83 -- -- 83 16 97 % -- --  05/19/20 0000 (!) 148/89 98.5 F (36.9 C) -- 83 18 96 % -- --  05/18/20 2300 (!) 123/101 -- -- 80 16 97 % -- --  05/18/20 2200 (!) 115/91 -- -- 83 16 97 % -- --  05/18/20 2100 (!) 147/80 98 F (36.7 C) -- 99 15 98 % -- --  05/18/20 2000 (!) 143/88 98.3 F (36.8 C) -- 95 15 94 % -- --  05/18/20 1900 139/80 98.4 F (36.9 C) -- 90 15 97 % -- --  05/18/20 1818 -- -- -- -- -- 98 % -- --  05/18/20 1800 135/65 -- -- 80 14 96 % -- --  05/18/20 1747 -- -- -- -- -- 97 % -- --  05/18/20 1745 -- -- -- -- -- 96 % -- --  05/18/20 1715 (!) 160/76 -- -- 70 16 98 % -- --  05/18/20 1709 -- 97.8 F (36.6 C) -- 73 12 96 % -- --  05/18/20 1643 -- -- -- -- -- 97 % -- --  05/18/20 1636 -- -- -- -- -- 97 % -- --  05/18/20 1627 -- -- -- 68 16 100 % -- --  05/18/20 1621 -- -- -- -- -- 95 % -- --  05/18/20 1617 -- -- -- -- -- 97 % -- --  05/18/20 1603 125/71 (!) 97.4 F (36.3 C) -- 70 (!) 21 95 % -- --  05/18/20 1400 134/75 -- -- 77 12 100 % -- --  05/18/20 1355 -- -- -- 77 16 99 % -- --  05/18/20 1350 -- -- -- 79 19 99 % -- --  05/18/20 1345 136/77 -- -- 79 14 99 % -- --  05/18/20 1340 -- -- -- 78 13 100 % -- --  05/18/20 1335 (!) 130/108 -- -- 74 19 100 % -- --  05/18/20 1331 -- -- -- 80 13 96 % -- --  05/18/20 1330 134/77 -- -- 80 14 98 % -- --  05/18/20 1329 -- -- -- 79 13 98 % -- --  05/18/20 1328 -- -- -- 81 13 98 % -- --  05/18/20 1327 -- -- -- 79 17 97 % -- --  05/18/20 1326 -- -- -- 83 12 97 % -- --  05/18/20 1325 140/78 -- -- 81 10 98 % -- --  05/18/20 1324 -- -- -- 87 (!) 9 99 % -- --  05/18/20 1323 -- -- -- 85 14 99 % -- --  05/18/20 1322 -- -- -- 90 (!) 25 99 % -- --  05/18/20 1321 -- -- -- 87 15 99 % -- --  05/18/20 1320 (!) 146/98 -- -- 82 19 99 % -- --  05/18/20 1315 (!) 146/75 -- -- 88 16 100 % -- --  05/18/20 1310 (!) 148/97 -- -- 88 18 99 % -- --  05/18/20 1210 -- 98.8 F (37.1 C) Oral 96 18 97 % 5\' 7"  (1.702 m) 85.8  kg     Recent laboratory studies:  Recent Labs    05/19/20 0339  WBC 14.3*  HGB 11.8*  HCT 36.4  PLT 148*  NA 143  K 4.9  CL 106  CO2 29  BUN 14  CREATININE 0.67  GLUCOSE 130*  CALCIUM 9.6     Discharge Medications:   Allergies as of 05/19/2020   No Known Allergies     Medication List    STOP taking these medications   acetaminophen 500 MG tablet Commonly known as: TYLENOL   HYDROcodone-acetaminophen 5-325 MG tablet Commonly known as: NORCO/VICODIN Replaced by: HYDROcodone-acetaminophen 7.5-325 MG tablet     TAKE these medications   aspirin 81 MG chewable tablet Chew 1 tablet (81 mg total) by mouth 2 (two) times daily.   baclofen 10 MG tablet Commonly known as: LIORESAL Take 1 tablet (10 mg total) by mouth 3 (three) times daily as needed for muscle spasms. What changed: when to take this   buPROPion 150 MG 24 hr tablet Commonly known as: WELLBUTRIN XL Take 150 mg by mouth every morning.   carbidopa-levodopa 25-100 MG tablet Commonly known as: SINEMET IR Take 1 tablet by mouth in the morning and at bedtime.   CINNAMON PO Take 2,000 mg by mouth daily.   ELDERBERRY ZINC/VIT C/IMMUNE MT Take 1 tablet by mouth daily. With Vit D   gabapentin 100 MG capsule Commonly known as: NEURONTIN Take 1 capsule (100 mg total) by mouth 3 (three) times daily.   Garlic 1000 MG Caps Take 1,000 mg by mouth daily.   Garlic 400 MG Tbec Take 400 mg by mouth daily.   HYDROcodone-acetaminophen 7.5-325 MG tablet Commonly known as: NORCO Take 1-2 tablets by mouth every 4 (four) hours as needed (pain). Replaces: HYDROcodone-acetaminophen 5-325 MG tablet   LORazepam 0.5 MG tablet Commonly known as: ATIVAN Take 0.5 mg by mouth in the morning, at noon, and at bedtime.   meloxicam 15 MG tablet  Commonly known as: MOBIC Take 15 mg by mouth daily.   pantoprazole 40 MG tablet Commonly known as: PROTONIX Take 40 mg by mouth daily.   rOPINIRole 1 MG tablet Commonly  known as: REQUIP Take 1 mg by mouth in the morning and at bedtime.   rosuvastatin 10 MG tablet Commonly known as: CRESTOR Take 10 mg by mouth 3 (three) times a week.   Vitamin D 50 MCG (2000 UT) tablet Take 2,000 Units by mouth daily.            Durable Medical Equipment  (From admission, onward)         Start     Ordered   05/18/20 1727  DME 3 n 1  Once        05/18/20 1726   05/18/20 1727  DME Walker rolling  Once       Question Answer Comment  Walker: With 5 Inch Wheels   Patient needs a walker to treat with the following condition Status post total knee replacement, right      05/18/20 1726          Diagnostic Studies: DG Knee Right Port  Result Date: 05/18/2020 CLINICAL DATA:  Post right knee replacement, images obtained in PACU EXAM: PORTABLE RIGHT KNEE - 1-2 VIEW COMPARISON:  Radiograph 10/29/2019 FINDINGS: Expected postsurgical changes from right knee arthroplasty with femoral, tibial and patellar components in expected alignment. Circumferential swelling, soft tissue gas and intra-articular gas with trace fluid are expected findings in the recent postoperative state. Overlying skin staples are present. No acute complication is evident. IMPRESSION: Expected postsurgical changes from right knee arthroplasty. No acute complication. Electronically Signed   By: Kreg Shropshire M.D.   On: 05/18/2020 22:47    Disposition: Discharge disposition: 01-Home or Self Care          Follow-up Information    Kathryne Hitch, MD Follow up in 2 week(s).   Specialty: Orthopedic Surgery Contact information: [address] [place] [address] [phone]                Signed: Richardean Canal 05/19/2020, 8:37 AM

## 2020-05-19 NOTE — TOC Transition Note (Signed)
Transition of Care [facility]) - CM/SW Discharge Note   Patient Details  Name: [name] MRN: [id number] Date of Birth: 11-23-1950  Transition of Care [facility]) CM/SW Contact:  Lia Hopping, [place] Phone Number:  05/19/2020, 12:58 PM   Clinical Narrative:    Re: [place] for physical therapy  CSW met with the patient bedside to discuss HHPT needs. Patient agreeable. She reports using [facility] home health in [place] last time. CSW reached out to the [place] location and sent a referral. CSW was notified the agency cannot staff and they are considered to be out of network with the patient current insurance policy. CSW called Medicare.gov list of agencies and made a referral to [facility] [place]. CSW confirm with intake staff Edd Fabian they will accept the patient and start care tomorrow. CSW notified the patient.  Agency contact information written on AVS.  Patient confirm she has DME.  No other need identified.   Final next level of care: [place] Barriers to Discharge: Barriers Resolved   Patient Goals and CMS Choice Patient states their goals for this hospitalization and ongoing recovery are:: return home with therapy CMS Medicare.gov Compare Post Acute Care list provided to:: Patient Choice offered to / list presented to : Patient  Discharge Placement                       Discharge Plan and Services                          HH Arranged: PT [facility] Agency: [facility] [place] [place]) Date [place]: 05/19/20 Time [place]: 1258 Representative spoke with at [place]: [place] ([place]) Interventions     Readmission Risk Interventions No flowsheet data found.

## 2020-05-19 NOTE — Progress Notes (Signed)
Physical Therapy Treatment Patient Details Name: [name] MRN: [id number] DOB: Jul 31, 1950 Today's Date: 05/19/2020    History of Present Illness Pt is a 70 year old female s/p R TKA with hx of L TKA 12/2019, COPD    PT Comments    Pt reports increase in pain since this morning however premedicated.  Pt ambulated 120 feet with RW however states she has 10/10 pain in right knee and worried about d/c home today.  RN notified of mobility and pain.  Pt declined HEP handout stating she will have HHPT and she also recalls some from previous L TKA.    Follow Up Recommendations  Follow surgeon's recommendation for DC plan and follow-up therapies (plans to have HHPT)     Equipment Recommendations  None recommended by PT    Recommendations for Other Services       Precautions / Restrictions Precautions Precautions: Fall;Knee Required Braces or Orthoses: Knee Immobilizer - Right Restrictions Weight Bearing Restrictions: No    Mobility  Bed Mobility Overal bed mobility: Needs Assistance Bed Mobility: Supine to Sit;Sit to Supine     Supine to sit: Min guard Sit to supine: Min guard   General bed mobility comments: cues for self assist, pt utilized gait belt  Transfers Overall transfer level: Needs assistance Equipment used: Rolling walker (2 wheeled) Transfers: Sit to/from Stand Sit to Stand: Min guard         General transfer comment: verbal cues for UE and LE positioning  Ambulation/Gait Ambulation/Gait assistance: Min guard Gait Distance (Feet): 120 Feet Assistive device: Rolling walker (2 wheeled) Gait Pattern/deviations: Step-to pattern;Decreased stance time - right;Antalgic     General Gait Details: verbal cues for RW positioning and posture; pt reports 10/10 pain however able to ambulate 120 feet; RN aware of pain   Stairs             Wheelchair Mobility    Modified Rankin (Stroke Patients Only)       Balance                                             Cognition Arousal/Alertness: Awake/alert Behavior During Therapy: WFL for tasks assessed/performed Overall Cognitive Status: Within Functional Limits for tasks assessed                                        Exercises Total Joint Exercises Quad Sets: AROM;Right;10 reps;Supine Heel Slides: AAROM;Right;10 reps;Supine Goniometric ROM: approx right knee flexion 35*, extension lacking 4*    General Comments        Pertinent Vitals/Pain Pain Assessment: 0-10 Pain Score: 10-Worst pain ever Pain Location: right knee Pain Descriptors / Indicators: Grimacing;Sore;Aching Pain Intervention(s): Premedicated before session;Repositioned;Monitored during session;Ice applied (RN aware)    Home Living Family/patient expects to be discharged to:: Private residence Living Arrangements: Alone Available Help at Discharge: Family Type of Home: Apartment Home Access: Level entry   Home Layout: One level Home Equipment: Cane - single point;Walker - 2 wheels;Bedside commode      Prior Function Level of Independence: Independent with assistive device(s)          PT Goals (current goals can now be found in the care plan section) Acute Rehab PT Goals PT Goal Formulation: With patient Time For Goal Achievement:  05/26/20 Potential to Achieve Goals: Good Progress towards PT goals: Progressing toward goals    Frequency    7X/week      PT Plan Current plan remains appropriate    Co-evaluation              AM-PAC PT "6 Clicks" Mobility   Outcome Measure  Help needed turning from your back to your side while in a flat bed without using bedrails?: None Help needed moving from lying on your back to sitting on the side of a flat bed without using bedrails?: A Little Help needed moving to and from a bed to a chair (including a wheelchair)?: A Little Help needed standing up from a chair using your arms (e.g., wheelchair or bedside  chair)?: A Little Help needed to walk in hospital room?: A Little Help needed climbing 3-5 steps with a railing? : A Little 6 Click Score: 19    End of Session Equipment Utilized During Treatment: Gait belt;Right knee immobilizer Activity Tolerance: Patient limited by pain;Patient tolerated treatment well Patient left: with bed alarm set;with call bell/phone within reach;with family/visitor present;in bed Nurse Communication: Mobility status PT Visit Diagnosis: Other abnormalities of gait and mobility (R26.89)     Time: 1440-1505 PT Time Calculation (min) (ACUTE ONLY): 25 min  Charges:  $Gait Training: 23-37 mins                     Thomasene Mohair PT, DPT Acute Rehabilitation Services Pager: [phone] Office: [phone]  Maida Sale E 05/19/2020, 3:35 PM

## 2020-05-19 NOTE — Progress Notes (Signed)
Subjective: 1 Day Post-Op Procedure(s) (LRB): RIGHT TOTAL KNEE ARTHROPLASTY (Right) Patient reports pain as severe.  States Oxycodone has never worked for her. No PT thus far.   Objective: Vital signs in last 24 hours: Temp:  [97.4 F (36.3 C)-98.8 F (37.1 C)] 98.2 F (36.8 C) (01/12 0614) Pulse Rate:  [68-99] 79 (01/12 0614) Resp:  [9-25] 16 (01/12 0614) BP: (115-160)/(65-108) 135/65 (01/12 0614) SpO2:  [94 %-100 %] 98 % (01/12 0614) Weight:  [85.8 kg] 85.8 kg (01/11 1210)  Intake/Output from previous day: 01/11 0701 - 01/12 0700 In: 2790 [P.O.:240; I.V.:2350; IV Piggyback:200] Out: 1605 [Urine:1580; Blood:25] Intake/Output this shift: No intake/output data recorded.  Recent Labs    05/19/20 0339  HGB 11.8*   Recent Labs    05/19/20 0339  WBC 14.3*  RBC 3.97  HCT 36.4  PLT 148*   Recent Labs    05/19/20 0339  NA 143  K 4.9  CL 106  CO2 29  BUN 14  CREATININE 0.67  GLUCOSE 130*  CALCIUM 9.6   No results for input(s): LABPT, INR in the last 72 hours.  Left lower extremity: Dorsiflexion/Plantar flexion intact Incision: scant drainage Compartment soft   Assessment/Plan: 1 Day Post-Op Procedure(s) (LRB): RIGHT TOTAL KNEE ARTHROPLASTY (Right) Up with therapy Discharge home with home health if patient does well with PT , remains stable and pain under control.      GILBERT CLARK 05/19/2020, 8:09 AM

## 2020-05-19 NOTE — Evaluation (Signed)
Physical Therapy Evaluation Patient Details Name: [name] MRN: [id number] DOB: [birth year] Today's Date: 05/19/2020   History of Present Illness  Pt is a 70 year old female s/p R TKA with hx of L TKA 12/2019, COPD  Clinical Impression  Pt is s/p TKA resulting in the deficits listed below (see PT Problem List).  Pt will benefit from skilled PT to increase their independence and safety with mobility to allow discharge to the venue listed below.  Pt premedicated for session and reports 6/10 pain left knee with ambulation.  Pt plans to d/c home with support from her friend and daughter.        Follow Up Recommendations Follow surgeon's recommendation for DC plan and follow-up therapies    Equipment Recommendations  None recommended by PT    Recommendations for Other Services       Precautions / Restrictions Precautions Precautions: Fall;Knee Required Braces or Orthoses: Knee Immobilizer - Right Restrictions Weight Bearing Restrictions: No      Mobility  Bed Mobility Overal bed mobility: Needs Assistance Bed Mobility: Supine to Sit     Supine to sit: Min assist;HOB elevated     General bed mobility comments: assist for right LE, cues for self assist    Transfers Overall transfer level: Needs assistance Equipment used: Rolling walker (2 wheeled) Transfers: Sit to/from Stand Sit to Stand: Min assist         General transfer comment: cues for hand placement, assist to rise and steady  Ambulation/Gait Ambulation/Gait assistance: Min guard Gait Distance (Feet): 120 Feet Assistive device: Rolling walker (2 wheeled) Gait Pattern/deviations: Step-to pattern;Decreased stance time - right;Antalgic     General Gait Details: verbal cues for RW positioning and posture  Stairs            Wheelchair Mobility    Modified Rankin (Stroke Patients Only)       Balance                                             Pertinent Vitals/Pain  Pain Assessment: 0-10 Pain Score: 6  Pain Location: right knee Pain Descriptors / Indicators: Grimacing;Sore;Aching Pain Intervention(s): Monitored during session;Repositioned;Premedicated before session;Ice applied    Home Living Family/patient expects to be discharged to:: Private residence Living Arrangements: Alone Available Help at Discharge: Family Type of Home: Apartment Home Access: Level entry     Home Layout: One level Home Equipment: Cane - single point;Walker - 2 wheels;Bedside commode      Prior Function Level of Independence: Independent with assistive device(s)               Hand Dominance        Extremity/Trunk Assessment        Lower Extremity Assessment Lower Extremity Assessment: RLE deficits/detail;LLE deficits/detail RLE Deficits / Details: unable to perform SLR RLE: Unable to fully assess due to pain LLE Deficits / Details: pt reports left knee flexion only 110* s/p left TKA in August       Communication   Communication: No difficulties  Cognition Arousal/Alertness: Awake/alert Behavior During Therapy: WFL for tasks assessed/performed Overall Cognitive Status: Within Functional Limits for tasks assessed  General Comments      Exercises     Assessment/Plan    PT Assessment Patient needs continued PT services  PT Problem List Decreased strength;Pain;Decreased range of motion;Decreased mobility       PT Treatment Interventions Stair training;Gait training;Balance training;DME instruction;Therapeutic exercise;Functional mobility training;Therapeutic activities;Patient/family education    PT Goals (Current goals can be found in the Care Plan section)  Acute Rehab PT Goals PT Goal Formulation: With patient Time For Goal Achievement: 05/26/20 Potential to Achieve Goals: Good    Frequency 7X/week   Barriers to discharge        Co-evaluation               AM-PAC  PT "6 Clicks" Mobility  Outcome Measure Help needed turning from your back to your side while in a flat bed without using bedrails?: None Help needed moving from lying on your back to sitting on the side of a flat bed without using bedrails?: A Little Help needed moving to and from a bed to a chair (including a wheelchair)?: A Little Help needed standing up from a chair using your arms (e.g., wheelchair or bedside chair)?: A Little Help needed to walk in hospital room?: A Little Help needed climbing 3-5 steps with a railing? : A Little 6 Click Score: 19    End of Session Equipment Utilized During Treatment: Gait belt;Right knee immobilizer Activity Tolerance: Patient tolerated treatment well Patient left: in chair;with call bell/phone within reach;with chair alarm set;with family/visitor present   PT Visit Diagnosis: Other abnormalities of gait and mobility (R26.89)    Time: 4818-5631 PT Time Calculation (min) (ACUTE ONLY): 20 min   Charges:   PT Evaluation $PT Eval Low Complexity: 1 Low        Kati PT, DPT Acute Rehabilitation Services Pager: [phone] Office: [phone]  Sarajane Jews 05/19/2020, 12:33 PM

## 2020-05-20 DIAGNOSIS — M1711 Unilateral primary osteoarthritis, right knee: Secondary | ICD-10-CM | POA: Diagnosis not present

## 2020-05-20 NOTE — Progress Notes (Signed)
Patient ID: [name], female   DOB: 02/20/1951, 70 y.o.   MRN: [id number] [name] pain seems to be better controlled today.  Her right operative knee is stable.  I did change the dressing.  Her foot is well-perfused and she can flex and extend the right foot.  The goal is hopefully to get her discharged to home by this afternoon.  She understands that as well.

## 2020-05-20 NOTE — Progress Notes (Signed)
Physical Therapy Treatment Patient Details Name: [name] MRN: [id number] DOB: 1950-09-14 Today's Date: 05/20/2020    History of Present Illness Pt is a 70 year old female s/p R TKA with hx of L TKA 12/2019, COPD    PT Comments    Pt reports pain improved today.  Pt ambulated in hallway and educated on use of KI for home.  Pt declined performing exercises prior to d/c.  Pt feels ready for d/c home today.   Follow Up Recommendations  Follow surgeon's recommendation for DC plan and follow-up therapies     Equipment Recommendations  None recommended by PT    Recommendations for Other Services       Precautions / Restrictions Precautions Precautions: Fall;Knee Required Braces or Orthoses: Knee Immobilizer - Right Restrictions Brown Bearing Restrictions: No    Mobility  Bed Mobility Overal bed mobility: Needs Assistance Bed Mobility: Supine to Sit     Supine to sit: Supervision     General bed mobility comments: pt utilized gait belt  Transfers Overall transfer level: Needs assistance Equipment used: Rolling walker (2 wheeled) Transfers: Sit to/from Stand Sit to Stand: Min guard         General transfer comment: verbal cues for UE and LE positioning  Ambulation/Gait Ambulation/Gait assistance: Min guard Gait Distance (Feet): 120 Feet Assistive device: Rolling walker (2 wheeled) Gait Pattern/deviations: Step-to pattern;Decreased stance time - right;Antalgic     General Gait Details: verbal cues for RW positioning and posture; pt reports pain improved today   Stairs             Wheelchair Mobility    Modified Rankin (Stroke Patients Only)       Balance                                            Cognition Arousal/Alertness: Awake/alert Behavior During Therapy: WFL for tasks assessed/performed Overall Cognitive Status: Within Functional Limits for tasks assessed                                         Exercises      General Comments        Pertinent Vitals/Pain Pain Assessment: 0-10 Pain Score: 5  Pain Location: right knee Pain Descriptors / Indicators: Grimacing;Sore;Aching Pain Intervention(s): Monitored during session;Premedicated before session;Repositioned    Home Living                      Prior Function            PT Goals (current goals can now be found in the care plan section) Progress towards PT goals: Progressing toward goals    Frequency    7X/week      PT Plan Current plan remains appropriate    Co-evaluation              AM-PAC PT "6 Clicks" Mobility   Outcome Measure  Help needed turning from your back to your side while in a flat bed without using bedrails?: None Help needed moving from lying on your back to sitting on the side of a flat bed without using bedrails?: A Little Help needed moving to and from a bed to a chair (including a wheelchair)?: A Little Help needed standing up  from a chair using your arms (e.g., wheelchair or bedside chair)?: A Little Help needed to walk in hospital room?: A Little Help needed climbing 3-5 steps with a railing? : A Little 6 Click Score: 19    End of Session Equipment Utilized During Treatment: Gait belt;Right knee immobilizer Activity Tolerance: Patient tolerated treatment well Patient left: in chair;with call bell/phone within reach;with chair alarm set Nurse Communication: Mobility status PT Visit Diagnosis: Other abnormalities of gait and mobility (R26.89)     Time: 6811-5726 PT Time Calculation (min) (ACUTE ONLY): 19 min  Charges:  $Gait Training: 8-22 mins                     Paulino Door, DPT Acute Rehabilitation Services Pager: [phone] Office: [phone]   Maida Sale E 05/20/2020, 3:48 PM

## 2020-05-20 NOTE — Plan of Care (Signed)
Patient dc'd all care plans met  

## 2020-05-20 NOTE — Plan of Care (Signed)

## 2020-05-20 NOTE — Progress Notes (Signed)
Patient verbalized understanding of dc instructions, incl. medications. Patient also aware to call home health agency listed in dc to set up time.

## 2020-05-21 ENCOUNTER — Encounter (HOSPITAL_COMMUNITY): Payer: Self-pay | Admitting: Orthopaedic Surgery

## 2020-05-25 ENCOUNTER — Encounter (HOSPITAL_COMMUNITY): Payer: Medicare Other

## 2020-05-25 ENCOUNTER — Telehealth: Payer: Self-pay

## 2020-05-25 ENCOUNTER — Other Ambulatory Visit: Payer: Self-pay

## 2020-05-25 ENCOUNTER — Telehealth: Payer: Self-pay | Admitting: *Deleted

## 2020-05-25 ENCOUNTER — Encounter (HOSPITAL_COMMUNITY): Payer: Self-pay | Admitting: Orthopaedic Surgery

## 2020-05-25 DIAGNOSIS — Z96651 Presence of right artificial knee joint: Secondary | ICD-10-CM

## 2020-05-25 NOTE — Telephone Encounter (Signed)
FYI

## 2020-05-25 NOTE — Telephone Encounter (Signed)
I had pt scheduled here at [facility] at 430pm to get Ultrasound DVT done, I called pt@ 315 and advised her of appt and pt states she can not make the appt due to having to wait on her sister to give her a ride there and feels like she will not make it by that time. I advised pt she had enough time to get there and that was urgent and should have it done today. Pt wanted to see if I could get her scheduled at [facility] [place] instead. I called over to [facility] [place] central scheduling and I was advised I needed to fax order and then they will contact pt to schedule, I stated this was urgnet and wanted to see if I could talk to someone to give a verbal order and get her scheduled and then I will fax over the order. I was told no it had to be faxed first. I faxed order has STAT.   I called pt back and advised her [facility] will be calling to schedule appt for today and if no appt is made today that she needed to go to ED and have the ultrasound done there instead. Pt stated she doesn t think that its a blood clot because she has been feeling bad like this since she came home from surgery, but will wait on the call to get scheduled and if had too she will have her sister to take her to the ED.

## 2020-05-25 NOTE — Telephone Encounter (Signed)
FYI Pt is s/p a right total knee replacement on 05/18/20 and physical therapy called states she has a positive homans sign and requesting instruction. Per [facility] to set pt up for an [place] to r/o DVT order has been sent to scheduling and pt is aware that we will call to get this arranged. To call report to Dr. Magnus Ivan.Marland Kitchen

## 2020-05-25 NOTE — Addendum Note (Signed)
Addendum  created 05/25/20 0738 by Sudie Grumbling, CRNA   Intraprocedure Event edited

## 2020-05-26 NOTE — Telephone Encounter (Signed)
She was negative.

## 2020-05-26 NOTE — Telephone Encounter (Signed)
Ok let us know when the doppler is back

## 2020-05-27 ENCOUNTER — Telehealth: Payer: Self-pay

## 2020-05-27 NOTE — Telephone Encounter (Signed)
I do not comprehend what is trying to be conveyed in the message below.  The patient is status post knee replacement and she needs therapy to work on range of motion and strengthening.  She is weightbearing as tolerated.

## 2020-05-27 NOTE — Telephone Encounter (Signed)
Pt states she still cant walk due to her muscle being swollen.   Pens unit when her therapist comes tomorrow but she just needs approval first

## 2020-05-28 NOTE — Telephone Encounter (Signed)
Asking if PT can use TENS unit

## 2020-05-28 NOTE — Telephone Encounter (Signed)
That is fine 

## 2020-05-28 NOTE — Telephone Encounter (Signed)
Patient aware of the below message from Gil  

## 2020-05-31 ENCOUNTER — Encounter: Payer: Self-pay | Admitting: Orthopaedic Surgery

## 2020-05-31 ENCOUNTER — Ambulatory Visit (INDEPENDENT_AMBULATORY_CARE_PROVIDER_SITE_OTHER): Payer: Medicare Other | Admitting: Orthopaedic Surgery

## 2020-05-31 DIAGNOSIS — Z96651 Presence of right artificial knee joint: Secondary | ICD-10-CM

## 2020-05-31 MED ORDER — HYDROCODONE-ACETAMINOPHEN 7.5-325 MG PO TABS: 1.0000 | ORAL_TABLET | ORAL | 0 refills | Status: DC | PRN

## 2020-05-31 NOTE — Progress Notes (Signed)
The patient is 2 weeks tomorrow status post a right total knee arthroplasty.  We replaced her left knee back in August.  She is doing well.  She does need a refill of hydrocodone.  Examination of her right knee shows that I did remove the staples in place Steri-Strips.  Her calf is soft.  She can flex about 90 degrees.  Her home health has been limited secondary to recent snow and ice weather that is limited therapist getting to her house.  I did give her a prescription for outpatient therapy to transition to for when she is able to get to an outpatient facility.  All question concerns were answered and addressed.  I will see her back in 4 weeks to see how she is doing overall but no x-rays are needed.

## 2020-06-01 ENCOUNTER — Telehealth: Payer: Self-pay | Admitting: Orthopaedic Surgery

## 2020-06-01 NOTE — Telephone Encounter (Signed)
Pt wants to switch her in-home therapy company and would like to know how she goes about doing that. Please call her [phone]

## 2020-06-02 NOTE — Telephone Encounter (Signed)
Per Dr. Magnus Ivan pt needs to do Out Patient PT now

## 2020-06-03 ENCOUNTER — Telehealth: Payer: Self-pay

## 2020-06-03 ENCOUNTER — Other Ambulatory Visit: Payer: Self-pay | Admitting: Orthopaedic Surgery

## 2020-06-03 ENCOUNTER — Other Ambulatory Visit: Payer: Self-pay

## 2020-06-03 DIAGNOSIS — Z96651 Presence of right artificial knee joint: Secondary | ICD-10-CM

## 2020-06-03 MED ORDER — HYDROCODONE-ACETAMINOPHEN 7.5-325 MG PO TABS: 1.0000 | ORAL_TABLET | ORAL | 0 refills | Status: DC | PRN

## 2020-06-03 NOTE — Telephone Encounter (Signed)
Patient called she is requesting rx refill for hydrocodone CB:[phone]

## 2020-06-07 ENCOUNTER — Telehealth: Payer: Self-pay | Admitting: Orthopaedic Surgery

## 2020-06-07 NOTE — Telephone Encounter (Signed)
Pt called stating she was referred for PT and she ended up switching to [facility] and they needed more info on her and so they will be faxing over some forms; pt would like this filled out and sent back so she can start her PT.  [phone]

## 2020-06-09 ENCOUNTER — Telehealth: Payer: Self-pay | Admitting: Orthopaedic Surgery

## 2020-06-09 MED ORDER — HYDROCODONE-ACETAMINOPHEN 7.5-325 MG PO TABS: 1.0000 | ORAL_TABLET | Freq: Four times a day (QID) | ORAL | 0 refills | Status: DC | PRN

## 2020-06-09 NOTE — Telephone Encounter (Signed)
Faxed to provided number  

## 2020-06-09 NOTE — Telephone Encounter (Signed)
Patient aware.

## 2020-06-09 NOTE — Telephone Encounter (Signed)
Pt wants refill on pain med

## 2020-06-09 NOTE — Telephone Encounter (Signed)
Logan with PT called stating she needed some additional info on the referral; she needs a demographic sheet for the pt as well as a HMP and finally she needs a signed order from [facility] stating he is referring her for PT.  Logan CB# [phone] Fax# [phone]

## 2020-06-09 NOTE — Telephone Encounter (Signed)
Pt called stating she needs a refill of her hydrocodone and she would like a CB about switching her PT  [phone]

## 2020-06-09 NOTE — Telephone Encounter (Signed)
I did send in more pain medication for her.  I am fine with her switching physical therapy for her knee status post knee replacement for wherever she would like to go.

## 2020-06-14 ENCOUNTER — Telehealth: Payer: Self-pay | Admitting: Orthopaedic Surgery

## 2020-06-14 NOTE — Telephone Encounter (Signed)
Verbal order given  

## 2020-06-14 NOTE — Telephone Encounter (Signed)
Logan from [facility] called requesting updated verbal physical therapy home health orders. [facility] states they had a shortage in staff and need an updated verbal orders for patient of home health PT. Phone number is [phone] or [phone].

## 2020-06-28 ENCOUNTER — Ambulatory Visit: Payer: Medicare Other | Admitting: Physician Assistant

## 2020-06-30 ENCOUNTER — Ambulatory Visit: Payer: Medicare Other | Admitting: Physician Assistant

## 2020-07-05 ENCOUNTER — Encounter: Payer: Self-pay | Admitting: Physician Assistant

## 2020-07-05 ENCOUNTER — Ambulatory Visit (INDEPENDENT_AMBULATORY_CARE_PROVIDER_SITE_OTHER): Payer: Medicare Other | Admitting: Physician Assistant

## 2020-07-05 VITALS — Ht 67.0 in | Wt 189.0 lb

## 2020-07-05 DIAGNOSIS — Z96651 Presence of right artificial knee joint: Secondary | ICD-10-CM

## 2020-07-05 MED ORDER — HYDROCODONE-ACETAMINOPHEN 7.5-325 MG PO TABS: 1.0000 | ORAL_TABLET | Freq: Four times a day (QID) | ORAL | 0 refills | Status: DC | PRN

## 2020-07-05 NOTE — Progress Notes (Signed)
HPI: [name] returns today status post right total knee arthroplasty 05/18/2020.  She states she is not doing as well as she did with her left total knee but feels it is because physical therapy has been sporadic.  She is ambulating with a cane.  She is asking for refill on her hydrocodone.  She is also having some right hip pain.  She has mild arthritic changes of the right hip on previous films that were taken in the spring 2021.  Physical exam: Right knee full extension flexes to 105 degrees.  No instability valgus varus stressing.  Mild edema calf supple nontender.  Surgical incisions well-healed no signs of infection. Right hip limited external and very limited internal rotation.  Internal rotation causes groin pain.  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  Hydrocodone was called in today.  See her back in 1 month if she still having hip pain at that point time recommend AP pelvis and lateral view of the right hip.  Questions were encouraged and answered at length.

## 2020-08-02 ENCOUNTER — Ambulatory Visit (INDEPENDENT_AMBULATORY_CARE_PROVIDER_SITE_OTHER): Payer: Medicare Other

## 2020-08-02 ENCOUNTER — Encounter: Payer: Self-pay | Admitting: Physician Assistant

## 2020-08-02 ENCOUNTER — Ambulatory Visit (INDEPENDENT_AMBULATORY_CARE_PROVIDER_SITE_OTHER): Payer: Medicare Other | Admitting: Physician Assistant

## 2020-08-02 DIAGNOSIS — M1611 Unilateral primary osteoarthritis, right hip: Secondary | ICD-10-CM

## 2020-08-02 DIAGNOSIS — M25551 Pain in right hip: Secondary | ICD-10-CM

## 2020-08-02 DIAGNOSIS — Z96651 Presence of right artificial knee joint: Secondary | ICD-10-CM

## 2020-08-02 MED ORDER — HYDROCODONE-ACETAMINOPHEN 7.5-325 MG PO TABS: 1.0000 | ORAL_TABLET | Freq: Four times a day (QID) | ORAL | 0 refills | Status: DC | PRN

## 2020-08-02 NOTE — Progress Notes (Signed)
Office Visit Note   Patient: [name]           Date of Birth: [birth year]           MRN: [id number] Visit Date: 08/02/2020              Requested by: Ignatius Specking, MD [address] [place],  [place] [address] PCP: Ignatius Specking, MD   Assessment & Plan: Visit Diagnoses:  1. Pain in right hip   2. Status post right knee replacement   3. Primary osteoarthritis of right hip     Plan: We will send her for an intra-articular injection of the right hip with Dr. Prince Rome at midnight.  She will continue use a cane of the left hand.  Continue work on range of motion strengthening right knee.  Like to continue with conservative treatment of the right hip.  Questions were encouraged and answered.  Follow-Up Instructions: Return in about 3 months (around 11/02/2020).   Orders:  Orders Placed This Encounter  Procedures  . XR HIP UNILAT W OR W/O PELVIS 2-3 VIEWS RIGHT   No orders of the defined types were placed in this encounter.     Procedures: No procedures performed   Clinical Data: No additional findings.   Subjective: Chief Complaint  Patient presents with  . Right Knee - Routine Post Op  . Right Hip - Pain, Follow-up    HPI [name] returns today follow-up of her right total knee arthroplasty which was performed on 05/18/2020.  States knee overall is improving.  She is working with therapy on range of motion strengthening.  Her main complaint though is right hip pain.  She has had 2 injections in her hip which is giving her some relief.  She has had no new injury to the hip.  Review of Systems See HPI.  Objective: Vital Signs: There were no vitals taken for this visit.  Physical Exam Constitutional:      Appearance: She is not ill-appearing or diaphoretic.  Pulmonary:     Effort: Pulmonary effort is normal.  Neurological:     Mental Status: She is alert and oriented to person, place, and time.  Psychiatric:        Mood and Affect: Mood normal.     Ortho  Exam Right knee full extension flexion to 105 degrees.  Surgical incisions healing well no signs of infection calf supple nontender.  Right hip limited external rotation.  Limited internal rotation with pain.  Ambulates with a cane. Specialty Comments:  multiple docs filling her Hydrocodone, no more from orthopedics   Imaging: XR HIP UNILAT W OR W/O PELVIS 2-3 VIEWS RIGHT  Result Date: 08/02/2020 AP pelvis lateral view of the right hip: No acute fracture.  Moderate arthritis of the right hip.  This has progressed since x-rays were taken last summer.  Both hips well located.    PMFS History: Patient Active Problem List   Diagnosis Date Noted  . Primary osteoarthritis of right hip 08/02/2020  . 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 Unilateral primary osteoarthritis, left knee 10/29/2019  . Unilateral primary osteoarthritis, right knee 10/29/2019   Past Medical History:  Diagnosis Date  . Anxiety   . Arthritis   . COPD (chronic obstructive pulmonary disease) (HCC)   . Depression   . GERD (gastroesophageal reflux disease)   . History of hiatal hernia   . RLS (restless  legs syndrome)     History reviewed. No pertinent family history.  Past Surgical History:  Procedure Laterality Date  . BREAST SURGERY     lumpectomy  . CHOLECYSTECTOMY    . JOINT REPLACEMENT    . TONSILLECTOMY    . TOTAL KNEE ARTHROPLASTY Left 12/16/2019   Procedure: LEFT TOTAL KNEE ARTHROPLASTY;  Surgeon: Kathryne Hitch, MD;  Location: [facility] OR;  Service: Orthopedics;  Laterality: Left;  . TOTAL KNEE ARTHROPLASTY Right 05/18/2020   Procedure: RIGHT TOTAL KNEE ARTHROPLASTY;  Surgeon: Kathryne Hitch, MD;  Location: [facility] ORS;  Service: Orthopedics;  Laterality: Right;   Social History   Occupational History  . Not on file  Tobacco Use  . Smoking status: Current Every Day Smoker    Packs/day: 0.50    Years: 40.00    Pack  years: 20.00  . Smokeless tobacco: Never Used  Substance and Sexual Activity  . Alcohol use: Never  . Drug use: Not on file  . Sexual activity: Not on file

## 2020-08-03 ENCOUNTER — Telehealth: Payer: Self-pay

## 2020-08-03 ENCOUNTER — Telehealth: Payer: Self-pay | Admitting: Orthopaedic Surgery

## 2020-08-03 NOTE — Telephone Encounter (Signed)
Patient called. She would like the hydrocodone sent to [id number] - [place], [place] - [address] AT [facility] [address] [place] [address]

## 2020-08-03 NOTE — Telephone Encounter (Signed)
I spoke with the pharmacist and she stated pt is getting norco 5mg  from another doctor and just filled 90 tablets on 07/13/20 and wanted to make sure it was ok to fill our rx. Per dr. 09/12/20 we need to cancel our rx since she is getting it from another MD. Rx was cancelled

## 2020-08-03 NOTE — Telephone Encounter (Signed)
Katie with [facility] would like call back concerning Rx for Hydrocodone.  Stated that patient is waiting at the pharmacy.  Cb# [phone].  Please advise.  Thank you.

## 2020-08-03 NOTE — Telephone Encounter (Signed)
No rx to be given per dr. Magnus Ivan

## 2020-08-03 NOTE — Telephone Encounter (Signed)
Patient aware of the below message  

## 2020-08-28 ENCOUNTER — Other Ambulatory Visit: Payer: Self-pay | Admitting: Physician Assistant

## 2020-08-30 NOTE — Telephone Encounter (Signed)
Please advise 

## 2020-09-27 ENCOUNTER — Encounter: Payer: Self-pay | Admitting: Family Medicine

## 2020-09-27 ENCOUNTER — Ambulatory Visit (INDEPENDENT_AMBULATORY_CARE_PROVIDER_SITE_OTHER): Payer: Medicare Other | Admitting: Family Medicine

## 2020-09-27 ENCOUNTER — Ambulatory Visit: Payer: Self-pay

## 2020-09-27 ENCOUNTER — Other Ambulatory Visit: Payer: Self-pay

## 2020-09-27 DIAGNOSIS — M1611 Unilateral primary osteoarthritis, right hip: Secondary | ICD-10-CM | POA: Diagnosis not present

## 2020-09-27 NOTE — Progress Notes (Signed)
Subjective: Patient is here for ultrasound-guided intra-articular right hip injection.   The previous 2 injections have given her good relief.  She would like another 1 prior to considering replacement.  Objective:  Pain with IR.  Procedure: Ultrasound guided injection is preferred based studies that show increased duration, increased effect, greater accuracy, decreased procedural pain, increased response rate, and decreased cost with ultrasound guided versus blind injection.   Verbal informed consent obtained.  Time-out conducted.  Noted no overlying erythema, induration, or other signs of local infection. Ultrasound-guided right hip injection: After sterile prep with Betadine, injected 4 cc 0.25% bupivacaine without epinephrine and 6 mg betamethasone using a 22-gauge spinal needle, passing the needle through the iliofemoral ligament into the femoral head/neck junction.  Injectate seen filling joint capsule.

## 2020-11-03 ENCOUNTER — Ambulatory Visit: Payer: Medicare Other | Admitting: Orthopaedic Surgery

## 2020-11-25 ENCOUNTER — Ambulatory Visit: Payer: Medicare Other | Admitting: Orthopaedic Surgery

## 2020-12-13 ENCOUNTER — Ambulatory Visit: Payer: Medicare Other | Admitting: Orthopaedic Surgery

## 2020-12-13 ENCOUNTER — Ambulatory Visit (INDEPENDENT_AMBULATORY_CARE_PROVIDER_SITE_OTHER): Payer: Medicare Other

## 2020-12-13 ENCOUNTER — Encounter: Payer: Self-pay | Admitting: Orthopaedic Surgery

## 2020-12-13 DIAGNOSIS — M25551 Pain in right hip: Secondary | ICD-10-CM

## 2020-12-13 DIAGNOSIS — Z96651 Presence of right artificial knee joint: Secondary | ICD-10-CM

## 2020-12-13 DIAGNOSIS — M1611 Unilateral primary osteoarthritis, right hip: Secondary | ICD-10-CM

## 2020-12-13 NOTE — Progress Notes (Signed)
The patient is right at 1 year status post a left total knee arthroplasty and 7 months status post a right total knee arthroplasty.  She has known arthritis in her right hip.  She is recently in May had a steroid injection under ultrasound in her right hip joint.  That only helped for a little bit.  She says the first injection helped her a lot more so with the right hip but not this most recent 1.  She is still working on rehabilitating her right knee in terms of motion.  Her left knee shows no swelling and has full range of motion.  Is stable on exam.  Her right knee which is the more recent total knee still has swelling to be expected.  It is stable and has less range of motion.  She has significant pain on internal ex rotation of the right hip with stiffness of the right hip as well.  2 views of the right knee show well-seated total knee arthroplasty with no complicating features.  This point I am recommending hip replacement for her right hip when she is ready to have this done.  Clinically and radiographically I am fine with proceeding but I want her to feel better about this type of surgery and she is not ready for it yet she states.  From my standpoint, I can see her back in 6 months to see how she is doing overall.  No x-rays will be needed of the knees.  We can have a standing low AP pelvis for that visit.  All questions and concerns were answered and addressed.

## 2021-01-07 ENCOUNTER — Other Ambulatory Visit: Payer: Self-pay | Admitting: Internal Medicine

## 2021-01-07 ENCOUNTER — Other Ambulatory Visit: Payer: Self-pay

## 2021-01-07 ENCOUNTER — Ambulatory Visit
Admission: RE | Admit: 2021-01-07 | Discharge: 2021-01-07 | Disposition: A | Discharge status: Home / Self Care | Payer: Medicare Other | Source: Ambulatory Visit | Attending: Internal Medicine | Admitting: Internal Medicine

## 2021-01-07 DIAGNOSIS — Z1231 Encounter for screening mammogram for malignant neoplasm of breast: Secondary | ICD-10-CM

## 2021-01-13 ENCOUNTER — Ambulatory Visit: Payer: Medicare Other | Admitting: Orthopaedic Surgery

## 2021-01-25 ENCOUNTER — Other Ambulatory Visit: Payer: Self-pay | Admitting: Internal Medicine

## 2021-01-25 DIAGNOSIS — R928 Other abnormal and inconclusive findings on diagnostic imaging of breast: Secondary | ICD-10-CM

## 2021-01-28 ENCOUNTER — Other Ambulatory Visit: Payer: Medicare Other

## 2021-03-12 ENCOUNTER — Other Ambulatory Visit: Payer: Self-pay | Admitting: Physician Assistant

## 2021-04-04 ENCOUNTER — Telehealth: Payer: Self-pay | Admitting: Orthopaedic Surgery

## 2021-04-04 DIAGNOSIS — M1611 Unilateral primary osteoarthritis, right hip: Secondary | ICD-10-CM

## 2021-04-04 NOTE — Telephone Encounter (Signed)
Pt called stating she is trying to get a surgery scheduled but wants to know if she could get a cortisone injection before than? Pt would like a CB to discuss how far apart the injection and surgery has to be.   [phone]

## 2021-04-04 NOTE — Telephone Encounter (Signed)
I called and advised pt. She stated understanding. Pt does want to have the injection to help get her through the holidays. I put the order in her chart. She is also wanting to set up surgery. Can you please do a surgery sheet for her?

## 2021-04-26 ENCOUNTER — Other Ambulatory Visit: Payer: Self-pay

## 2021-04-26 ENCOUNTER — Encounter: Payer: Self-pay | Admitting: Physical Medicine and Rehabilitation

## 2021-04-26 ENCOUNTER — Ambulatory Visit: Payer: Medicare Other | Admitting: Physical Medicine and Rehabilitation

## 2021-04-26 ENCOUNTER — Ambulatory Visit: Payer: Self-pay

## 2021-04-26 DIAGNOSIS — M25551 Pain in right hip: Secondary | ICD-10-CM | POA: Diagnosis not present

## 2021-04-26 NOTE — Progress Notes (Signed)
Pt state right hip pain. Pt state walking and standing makes the pain worse. Pt state she takes pain meds to help ease his pain.  Numeric Pain Rating Scale and Functional Assessment Average Pain 5   In the last MONTH (on 0-10 scale) has pain interfered with the following?  1. General activity like being  able to carry out your everyday physical activities such as walking, climbing stairs, carrying groceries, or moving a chair?  Rating(9)   -BT, -Dye Allergies.

## 2021-04-26 NOTE — Progress Notes (Signed)
° °  [name] - 70 y.o. female MRN [id number]  Date of birth: January 28, 1951  Office Visit Note: Visit Date: 04/26/2021 PCP: Ignatius Specking, MD Referred by: Ignatius Specking, MD  Subjective: Chief Complaint  Patient presents with   Right Hip - Pain   HPI:  [name] is a 70 y.o. female who comes in today at the request of Dr. Doneen Poisson for planned Right anesthetic hip arthrogram with fluoroscopic guidance.  The patient has failed conservative care including home exercise, medications, time and activity modification.  This injection will be diagnostic and hopefully therapeutic.  Please see requesting physician notes for further details and justification.   ROS Otherwise per HPI.  Assessment & Plan: Visit Diagnoses:    ICD-10-CM   1. Pain in right hip  M25.551 XR C-ARM NO REPORT    Large Joint Inj: R hip joint      Plan: No additional findings.   Meds & Orders: No orders of the defined types were placed in this encounter.   Orders Placed This Encounter  Procedures   Large Joint Inj: R hip joint   XR C-ARM NO REPORT    Follow-up: Return for visit to requesting provider as needed.   Procedures: Large Joint Inj: R hip joint on 04/26/2021 2:02 PM Indications: diagnostic evaluation and pain Details: 22 G 3.5 in needle, fluoroscopy-guided anterior approach  Arthrogram: No  Medications: 4 mL bupivacaine 0.25 %; 60 mg triamcinolone acetonide 40 MG/ML Outcome: tolerated well, no immediate complications  There was excellent flow of contrast producing a partial arthrogram of the hip. The patient did have relief of symptoms during the anesthetic phase of the injection. Procedure, treatment alternatives, risks and benefits explained, specific risks discussed. Consent was given by the patient. Immediately prior to procedure a time out was called to verify the correct patient, procedure, equipment, support staff and site/side marked as required. Patient was prepped and  draped in the usual sterile fashion.         Clinical History: No specialty comments available.     Objective:  VS:  HT:     WT:    BMI:      BP:    HR: bpm   TEMP: ( )   RESP:  Physical Exam   Imaging: No results found.

## 2021-05-19 ENCOUNTER — Other Ambulatory Visit: Payer: Self-pay

## 2021-05-24 ENCOUNTER — Other Ambulatory Visit: Payer: Self-pay | Admitting: Physician Assistant

## 2021-05-24 DIAGNOSIS — M1611 Unilateral primary osteoarthritis, right hip: Secondary | ICD-10-CM

## 2021-06-01 MED ORDER — BUPIVACAINE HCL 0.25 % IJ SOLN
4.0000 mL | INTRAMUSCULAR | Status: AC | PRN
Start: 2021-04-26 — End: 2021-04-26
  Administered 2021-04-26: 14:00:00 4 mL via INTRA_ARTICULAR

## 2021-06-01 MED ORDER — TRIAMCINOLONE ACETONIDE 40 MG/ML IJ SUSP
60.0000 mg | INTRAMUSCULAR | Status: AC | PRN
  Administered 2021-04-26: 14:00:00 60 mg via INTRA_ARTICULAR

## 2021-06-09 NOTE — Pre-Procedure Instructions (Signed)
Surgical Instructions    Your procedure is scheduled on Tuesday 06/14/21.   Report to [facility] Main Entrance "A" at 07:40 A.M., then check in with the Admitting office.  Call this number if you have problems the morning of surgery:  [phone]   If you have any questions prior to your surgery date call [phone]: Open Monday-Friday 8am-4pm    Remember:  Do not eat after midnight the night before your surgery  You may drink clear liquids until 06:40 A.M. the morning of your surgery.   Clear liquids allowed are: Water, Non-Citrus Juices (without pulp), Carbonated Beverages, Clear Tea, Black Coffee ONLY (NO MILK, CREAM OR POWDERED CREAMER of any kind), and Gatorade   Enhanced Recovery after Surgery for Orthopedics Enhanced Recovery after Surgery is a protocol used to improve the stress on your body and your recovery after surgery.  Patient Instructions  The day of surgery (if you do NOT have diabetes):  Drink ONE (1) Pre-Surgery Clear Ensure by 06:40 am the morning of surgery   This drink was given to you during your hospital  pre-op appointment visit. Nothing else to drink after completing the  Pre-Surgery Clear Ensure.         If you have questions, please contact your surgeons office.     Take these medicines the morning of surgery with A SIP OF WATER:   buPROPion (WELLBUTRIN XL)  pantoprazole (PROTONIX)  rosuvastatin (CRESTOR)  rOPINIRole (REQUIP)    Take these medicines if needed:   baclofen (LIORESAL)  gabapentin (NEURONTIN)   HYDROcodone-acetaminophen (NORCO/VICODIN)  LORazepam (ATIVAN)  As of today, STOP taking any Aspirin (unless otherwise instructed by your surgeon) meloxicam (MOBIC), Aleve, Naproxen, Ibuprofen, Motrin, Advil, Goody's, BC's, all herbal medications, fish oil, and all vitamins.  After your COVID test   You are not required to quarantine however you are required to wear a well-fitting mask when you are out and around people not in your  household.  If your mask becomes wet or soiled, replace with a new one.  Wash your hands often with soap and water for 20 seconds or clean your hands with an alcohol-based hand sanitizer that contains at least 60% alcohol.  Do not share personal items.  Notify your provider: if you are in close contact with someone who has COVID  or if you develop a fever of 100.4 or greater, sneezing, cough, sore throat, shortness of breath or body aches.           Do not wear jewelry or makeup Do not wear lotions, powders, perfumes/colognes, or deodorant. Do not shave 48 hours prior to surgery.  Men may shave face and neck. Do not bring valuables to the hospital. Do not wear nail polish, gel polish, artificial nails, or any other type of covering on natural nails (fingers and toes) If you have artificial nails or gel coating that need to be removed by a nail salon, please have this removed prior to surgery. Artificial nails or gel coating may interfere with anesthesia's ability to adequately monitor your vital signs.             [facility] is not responsible for any belongings or valuables.  Do NOT Smoke (Tobacco/Vaping)  24 hours prior to your procedure  If you use a CPAP at night, you may bring your mask for your overnight stay.   Contacts, glasses, hearing aids, dentures or partials may not be worn into surgery, please bring cases for these belongings   For  patients admitted to the hospital, discharge time will be determined by your treatment team.   Patients discharged the day of surgery will not be allowed to drive home, and someone needs to stay with them for 24 hours.  NO VISITORS WILL BE ALLOWED IN PRE-OP WHERE PATIENTS ARE PREPPED FOR SURGERY.  ONLY 1 SUPPORT PERSON MAY BE PRESENT IN THE WAITING ROOM WHILE YOU ARE IN SURGERY.  IF YOU ARE TO BE ADMITTED, ONCE YOU ARE IN YOUR ROOM YOU WILL BE ALLOWED TWO (2) VISITORS. 1 (ONE) VISITOR MAY STAY OVERNIGHT BUT MUST ARRIVE TO THE ROOM BY 8pm.   Minor children may have two parents present. Special consideration for safety and communication needs will be reviewed on a case by case basis.  Special instructions:    Oral Hygiene is also important to reduce your risk of infection.  Remember - BRUSH YOUR TEETH THE MORNING OF SURGERY WITH YOUR REGULAR TOOTHPASTE   [facility]- Preparing For Surgery  Before surgery, you can play an important role. Because skin is not sterile, your skin needs to be as free of germs as possible. You can reduce the number of germs on your skin by washing with CHG (chlorahexidine gluconate) Soap before surgery.  CHG is an antiseptic cleaner which kills germs and bonds with the skin to continue killing germs even after washing.     Please do not use if you have an allergy to CHG or antibacterial soaps. If your skin becomes reddened/irritated stop using the CHG.  Do not shave (including legs and underarms) for at least 48 hours prior to first CHG shower. It is OK to shave your face.  Please follow these instructions carefully.     Shower the NIGHT BEFORE SURGERY and the MORNING OF SURGERY with CHG Soap.   If you chose to wash your hair, wash your hair first as usual with your normal shampoo. After you shampoo, rinse your hair and body thoroughly to remove the shampoo.  Then [employer] and genitals (private parts) with your normal soap and rinse thoroughly to remove soap.  After that Use CHG Soap as you would any other liquid soap. You can apply CHG directly to the skin and wash gently with a scrungie or a clean washcloth.   Apply the CHG Soap to your body ONLY FROM THE NECK DOWN.  Do not use on open wounds or open sores. Avoid contact with your eyes, ears, mouth and genitals (private parts). Wash Face and genitals (private parts)  with your normal soap.   Wash thoroughly, paying special attention to the area where your surgery will be performed.  Thoroughly rinse your body with warm water from the neck  down.  DO NOT shower/wash with your normal soap after using and rinsing off the CHG Soap.  Pat yourself dry with a CLEAN TOWEL.  Wear CLEAN PAJAMAS to bed the night before surgery  Place CLEAN SHEETS on your bed the night before your surgery  DO NOT SLEEP WITH PETS.   Day of Surgery:  Take a shower with CHG soap. Wear Clean/Comfortable clothing the morning of surgery Do not apply any deodorants/lotions.   Remember to brush your teeth WITH YOUR REGULAR TOOTHPASTE.   Please read over the following fact sheets that you were given.

## 2021-06-10 ENCOUNTER — Encounter (HOSPITAL_COMMUNITY)
Admission: RE | Admit: 2021-06-10 | Discharge: 2021-06-10 | Disposition: A | Discharge status: Home / Self Care | Payer: Medicare Other | Source: Ambulatory Visit | Attending: Orthopaedic Surgery | Admitting: Orthopaedic Surgery

## 2021-06-10 ENCOUNTER — Other Ambulatory Visit: Payer: Self-pay

## 2021-06-10 ENCOUNTER — Encounter (HOSPITAL_COMMUNITY): Payer: Self-pay

## 2021-06-10 VITALS — BP 127/84 | HR 78 | Temp 98.7°F | Resp 17 | Ht 64.0 in | Wt 183.3 lb

## 2021-06-10 DIAGNOSIS — Z01812 Encounter for preprocedural laboratory examination: Secondary | ICD-10-CM | POA: Diagnosis not present

## 2021-06-10 DIAGNOSIS — M1611 Unilateral primary osteoarthritis, right hip: Secondary | ICD-10-CM | POA: Diagnosis not present

## 2021-06-10 DIAGNOSIS — Z20822 Contact with and (suspected) exposure to covid-19: Secondary | ICD-10-CM | POA: Insufficient documentation

## 2021-06-10 DIAGNOSIS — Z01818 Encounter for other preprocedural examination: Secondary | ICD-10-CM

## 2021-06-10 LAB — BASIC METABOLIC PANEL
Anion gap: 8 (ref 5–15)
BUN: 12 mg/dL (ref 8–23)
CO2: 25 mmol/L (ref 22–32)
Calcium: 9.9 mg/dL (ref 8.9–10.3)
Chloride: 106 mmol/L (ref 98–111)
Creatinine, Ser: 0.93 mg/dL (ref 0.44–1.00)
GFR, Estimated: >60 mL/min (ref >60)
Glucose, Bld: 94 mg/dL (ref 70–99)
Potassium: 4.1 mmol/L (ref 3.5–5.1)
Sodium: 139 mmol/L (ref 135–145)

## 2021-06-10 LAB — CBC
HCT: 43.3 % (ref 36.0–46.0)
Hemoglobin: 14.8 g/dL (ref 12.0–15.0)
MCH: 31.7 pg (ref 26.0–34.0)
MCHC: 34.2 g/dL (ref 30.0–36.0)
MCV: 92.7 fL (ref 80.0–100.0)
Platelets: 144 10*3/uL — ABNORMAL LOW (ref 150–400)
RBC: 4.67 MIL/uL (ref 3.87–5.11)
RDW: 12.4 % (ref 11.5–15.5)
WBC: 6.3 10*3/uL (ref 4.0–10.5)
nRBC: 0 % (ref 0.0–0.2)

## 2021-06-10 LAB — TYPE AND SCREEN
ABO/RH(D): A POS
Antibody Screen: NEGATIVE

## 2021-06-10 LAB — SURGICAL PCR SCREEN
MRSA, PCR: NEGATIVE
Staphylococcus aureus: POSITIVE — AB

## 2021-06-10 LAB — SARS CORONAVIRUS 2 (TAT 6-24 HRS): SARS Coronavirus 2: NEGATIVE

## 2021-06-10 NOTE — Progress Notes (Signed)
PCP - Dr. Sherril Croon Cardiologist - denies  PPM/ICD - n/a Device Orders - n/a Rep Notified - n/a  Chest x-ray - n/a EKG - n/a Stress Test - denies ECHO - denies Cardiac Cath - denies  Sleep Study - Yes. No OSA CPAP - n/a  Fasting Blood Sugar - n/a Checks Blood Sugar _____ times a day- n/a  Blood Thinner Instructions: n/a Aspirin Instructions: n/a  ERAS Protcol - Yes PRE-SURGERY Ensure or G2- Ensure  COVID TEST- 06/10/21. Pending.    Anesthesia review: No  Patient denies shortness of breath, fever, cough and chest pain at PAT appointment   All instructions explained to the patient, with a verbal understanding of the material. Patient agrees to go over the instructions while at home for a better understanding. The opportunity to ask questions was provided.

## 2021-06-13 NOTE — Anesthesia Preprocedure Evaluation (Addendum)
Anesthesia Evaluation  Patient identified by MRN, date of birth, ID band Patient awake    Reviewed: Allergy & Precautions, NPO status , Patient's Chart, lab work & pertinent test results  History of Anesthesia Complications Negative for: history of anesthetic complications  Airway Mallampati: II  TM Distance: >3 FB Neck ROM: Full    Dental  (+) Missing,    Pulmonary COPD, Patient abstained from smoking., former smoker,    Pulmonary exam normal        Cardiovascular negative cardio ROS Normal cardiovascular exam     Neuro/Psych Anxiety Depression RLS    GI/Hepatic Neg liver ROS, hiatal hernia, GERD  Medicated and Controlled,  Endo/Other  negative endocrine ROS  Renal/GU negative Renal ROS  negative genitourinary   Musculoskeletal  (+) Arthritis ,   Abdominal   Peds  Hematology Hgb 14.8, Plt 144k   Anesthesia Other Findings Day of surgery medications reviewed with patient.  Reproductive/Obstetrics negative OB ROS                            Anesthesia Physical Anesthesia Plan  ASA: 2  Anesthesia Plan: Spinal   Post-op Pain Management: Tylenol PO (pre-op)   Induction:   PONV Risk Score and Plan: 3 and Treatment may vary due to age or medical condition, Ondansetron, Propofol infusion, Dexamethasone and Midazolam  Airway Management Planned: Natural Airway and Simple Face Mask  Additional Equipment: None  Intra-op Plan:   Post-operative Plan:   Informed Consent: I have reviewed the patients History and Physical, chart, labs and discussed the procedure including the risks, benefits and alternatives for the proposed anesthesia with the patient or authorized representative who has indicated his/her understanding and acceptance.       Plan Discussed with: CRNA  Anesthesia Plan Comments:        Anesthesia Quick Evaluation

## 2021-06-14 ENCOUNTER — Encounter (HOSPITAL_COMMUNITY): Payer: Self-pay | Admitting: Orthopaedic Surgery

## 2021-06-14 ENCOUNTER — Ambulatory Visit (HOSPITAL_COMMUNITY): Payer: Medicare Other

## 2021-06-14 ENCOUNTER — Encounter (HOSPITAL_COMMUNITY)
Admission: RE | Disposition: A | Discharge status: Home / Self Care | Payer: Self-pay | Source: Home / Self Care | Attending: Orthopaedic Surgery

## 2021-06-14 ENCOUNTER — Other Ambulatory Visit: Payer: Self-pay

## 2021-06-14 ENCOUNTER — Ambulatory Visit (HOSPITAL_COMMUNITY): Payer: Medicare Other | Admitting: Anesthesiology

## 2021-06-14 ENCOUNTER — Observation Stay (HOSPITAL_COMMUNITY)
Admission: RE | Admit: 2021-06-14 | Discharge: 2021-06-15 | Disposition: A | Discharge status: Home / Self Care | Payer: Medicare Other | Attending: Orthopaedic Surgery | Admitting: Orthopaedic Surgery

## 2021-06-14 ENCOUNTER — Observation Stay (HOSPITAL_COMMUNITY): Payer: Medicare Other

## 2021-06-14 DIAGNOSIS — M1611 Unilateral primary osteoarthritis, right hip: Principal | ICD-10-CM | POA: Diagnosis present

## 2021-06-14 DIAGNOSIS — G2581 Restless legs syndrome: Secondary | ICD-10-CM | POA: Diagnosis not present

## 2021-06-14 DIAGNOSIS — Z79899 Other long term (current) drug therapy: Secondary | ICD-10-CM | POA: Diagnosis not present

## 2021-06-14 DIAGNOSIS — J449 Chronic obstructive pulmonary disease, unspecified: Secondary | ICD-10-CM | POA: Insufficient documentation

## 2021-06-14 DIAGNOSIS — Z96641 Presence of right artificial hip joint: Secondary | ICD-10-CM

## 2021-06-14 DIAGNOSIS — Z96653 Presence of artificial knee joint, bilateral: Secondary | ICD-10-CM | POA: Diagnosis not present

## 2021-06-14 DIAGNOSIS — Z87891 Personal history of nicotine dependence: Secondary | ICD-10-CM | POA: Insufficient documentation

## 2021-06-14 DIAGNOSIS — Z419 Encounter for procedure for purposes other than remedying health state, unspecified: Secondary | ICD-10-CM

## 2021-06-14 HISTORY — PX: TOTAL HIP ARTHROPLASTY: SHX124

## 2021-06-14 SURGERY — ARTHROPLASTY, HIP, TOTAL, ANTERIOR APPROACH
Anesthesia: Spinal | Site: Hip | Laterality: Right

## 2021-06-14 MED ORDER — OXYCODONE HCL 5 MG PO TABS
5.0000 mg | ORAL_TABLET | ORAL | Status: DC | PRN
  Administered 2021-06-14: 10 mg via ORAL
  Filled 2021-06-14: qty 2

## 2021-06-14 MED ORDER — ACETAMINOPHEN 500 MG PO TABS
1000.0000 mg | ORAL_TABLET | Freq: Once | ORAL | Status: AC
  Administered 2021-06-14: 1000 mg via ORAL
  Filled 2021-06-14: qty 2

## 2021-06-14 MED ORDER — DEXAMETHASONE SODIUM PHOSPHATE 10 MG/ML IJ SOLN
INTRAMUSCULAR | Status: DC | PRN
  Administered 2021-06-14: 10 mg via INTRAVENOUS

## 2021-06-14 MED ORDER — LACTATED RINGERS IV SOLN: INTRAVENOUS | Status: DC

## 2021-06-14 MED ORDER — DOCUSATE SODIUM 100 MG PO CAPS
100.0000 mg | ORAL_CAPSULE | Freq: Two times a day (BID) | ORAL | Status: DC
  Administered 2021-06-14 – 2021-06-15 (×3): 100 mg via ORAL
  Filled 2021-06-14 (×3): qty 1

## 2021-06-14 MED ORDER — OXYCODONE HCL 5 MG PO TABS: 10.0000 mg | ORAL_TABLET | ORAL | Status: DC | PRN

## 2021-06-14 MED ORDER — LORAZEPAM 0.5 MG PO TABS
0.5000 mg | ORAL_TABLET | Freq: Three times a day (TID) | ORAL | Status: DC | PRN
  Administered 2021-06-14: 0.5 mg via ORAL
  Filled 2021-06-14: qty 1

## 2021-06-14 MED ORDER — PROPOFOL 10 MG/ML IV BOLUS
INTRAVENOUS | Status: DC | PRN
  Administered 2021-06-14: 30 mg via INTRAVENOUS

## 2021-06-14 MED ORDER — ORAL CARE MOUTH RINSE: 15.0000 mL | Freq: Once | OROMUCOSAL | Status: AC

## 2021-06-14 MED ORDER — PHENOL 1.4 % MT LIQD: 1.0000 | OROMUCOSAL | Status: DC | PRN

## 2021-06-14 MED ORDER — DEXAMETHASONE SODIUM PHOSPHATE 10 MG/ML IJ SOLN
INTRAMUSCULAR | Status: AC
  Filled 2021-06-14: qty 1

## 2021-06-14 MED ORDER — PROPOFOL 500 MG/50ML IV EMUL
INTRAVENOUS | Status: DC | PRN
Start: 2021-06-14 — End: 2021-06-14
  Administered 2021-06-14: 70 ug/kg/min via INTRAVENOUS

## 2021-06-14 MED ORDER — ALUM & MAG HYDROXIDE-SIMETH 200-200-20 MG/5ML PO SUSP: 30.0000 mL | ORAL | Status: DC | PRN

## 2021-06-14 MED ORDER — OXYCODONE HCL 5 MG/5ML PO SOLN: 5.0000 mg | Freq: Once | ORAL | Status: DC | PRN

## 2021-06-14 MED ORDER — BACLOFEN 10 MG PO TABS
10.0000 mg | ORAL_TABLET | Freq: Three times a day (TID) | ORAL | 0 refills | Status: AC | PRN
Start: 2021-06-14 — End: ?

## 2021-06-14 MED ORDER — 0.9 % SODIUM CHLORIDE (POUR BTL) OPTIME
TOPICAL | Status: DC | PRN
  Administered 2021-06-14: 1000 mL

## 2021-06-14 MED ORDER — OXYCODONE-ACETAMINOPHEN 5-325 MG PO TABS
1.0000 | ORAL_TABLET | ORAL | Status: DC | PRN
  Administered 2021-06-14 – 2021-06-15 (×5): 2 via ORAL
  Filled 2021-06-14 (×5): qty 2

## 2021-06-14 MED ORDER — CEFAZOLIN SODIUM-DEXTROSE 1-4 GM/50ML-% IV SOLN
1.0000 g | Freq: Four times a day (QID) | INTRAVENOUS | Status: AC
  Administered 2021-06-14 (×2): 1 g via INTRAVENOUS
  Filled 2021-06-14 (×2): qty 50

## 2021-06-14 MED ORDER — BUPIVACAINE IN DEXTROSE 0.75-8.25 % IT SOLN
INTRATHECAL | Status: DC | PRN
  Administered 2021-06-14: 1.6 mL via INTRATHECAL

## 2021-06-14 MED ORDER — TRANEXAMIC ACID-NACL 1000-0.7 MG/100ML-% IV SOLN
1000.0000 mg | INTRAVENOUS | Status: AC
  Administered 2021-06-14: 1000 mg via INTRAVENOUS
  Filled 2021-06-14: qty 100

## 2021-06-14 MED ORDER — SODIUM CHLORIDE 0.9 % IV SOLN: INTRAVENOUS | Status: DC

## 2021-06-14 MED ORDER — HYDROMORPHONE HCL 1 MG/ML IJ SOLN
INTRAMUSCULAR | Status: AC
  Filled 2021-06-14: qty 1

## 2021-06-14 MED ORDER — VITAMIN D 25 MCG (1000 UNIT) PO TABS
5000.0000 [IU] | ORAL_TABLET | Freq: Every day | ORAL | Status: DC
  Administered 2021-06-14: 5000 [IU] via ORAL
  Filled 2021-06-14: qty 5

## 2021-06-14 MED ORDER — ASPIRIN 81 MG PO CHEW
81.0000 mg | CHEWABLE_TABLET | Freq: Two times a day (BID) | ORAL | Status: DC
  Administered 2021-06-14 – 2021-06-15 (×2): 81 mg via ORAL
  Filled 2021-06-14 (×2): qty 1

## 2021-06-14 MED ORDER — ROPINIROLE HCL 1 MG PO TABS
1.0000 mg | ORAL_TABLET | Freq: Two times a day (BID) | ORAL | Status: DC
  Administered 2021-06-14 – 2021-06-15 (×2): 1 mg via ORAL
  Filled 2021-06-14 (×2): qty 1

## 2021-06-14 MED ORDER — DIPHENHYDRAMINE HCL 12.5 MG/5ML PO ELIX: 12.5000 mg | ORAL_SOLUTION | ORAL | Status: DC | PRN

## 2021-06-14 MED ORDER — HYDROMORPHONE HCL 1 MG/ML IJ SOLN
0.5000 mg | INTRAMUSCULAR | Status: DC | PRN
  Administered 2021-06-14: 0.5 mg via INTRAVENOUS
  Administered 2021-06-14: 1 mg via INTRAVENOUS
  Filled 2021-06-14: qty 1

## 2021-06-14 MED ORDER — SODIUM CHLORIDE 0.9 % IR SOLN
Status: DC | PRN
  Administered 2021-06-14: 1000 mL

## 2021-06-14 MED ORDER — METHOCARBAMOL 500 MG PO TABS
500.0000 mg | ORAL_TABLET | Freq: Four times a day (QID) | ORAL | Status: DC | PRN
  Administered 2021-06-14 – 2021-06-15 (×2): 500 mg via ORAL
  Filled 2021-06-14 (×2): qty 1

## 2021-06-14 MED ORDER — MENTHOL 3 MG MT LOZG
1.0000 | LOZENGE | OROMUCOSAL | Status: DC | PRN
Start: 2021-06-14 — End: 2021-06-15

## 2021-06-14 MED ORDER — OXYCODONE HCL 5 MG PO TABS: 5.0000 mg | ORAL_TABLET | Freq: Once | ORAL | Status: DC | PRN

## 2021-06-14 MED ORDER — OXYCODONE-ACETAMINOPHEN 5-325 MG PO TABS: 1.0000 | ORAL_TABLET | Freq: Four times a day (QID) | ORAL | 0 refills | Status: AC | PRN

## 2021-06-14 MED ORDER — PANTOPRAZOLE SODIUM 40 MG PO TBEC
40.0000 mg | DELAYED_RELEASE_TABLET | Freq: Every day | ORAL | Status: DC
  Administered 2021-06-15: 40 mg via ORAL
  Filled 2021-06-14: qty 1

## 2021-06-14 MED ORDER — ONDANSETRON HCL 4 MG/2ML IJ SOLN
INTRAMUSCULAR | Status: DC | PRN
Start: 2021-06-14 — End: 2021-06-14
  Administered 2021-06-14: 4 mg via INTRAVENOUS

## 2021-06-14 MED ORDER — ONDANSETRON HCL 4 MG/2ML IJ SOLN
INTRAMUSCULAR | Status: AC
  Filled 2021-06-14: qty 2

## 2021-06-14 MED ORDER — FENTANYL CITRATE (PF) 100 MCG/2ML IJ SOLN: 25.0000 ug | INTRAMUSCULAR | Status: DC | PRN

## 2021-06-14 MED ORDER — METOCLOPRAMIDE HCL 5 MG/ML IJ SOLN: 5.0000 mg | Freq: Three times a day (TID) | INTRAMUSCULAR | Status: DC | PRN

## 2021-06-14 MED ORDER — ONDANSETRON HCL 4 MG/2ML IJ SOLN: 4.0000 mg | Freq: Four times a day (QID) | INTRAMUSCULAR | Status: DC | PRN

## 2021-06-14 MED ORDER — BACLOFEN 10 MG PO TABS
10.0000 mg | ORAL_TABLET | Freq: Three times a day (TID) | ORAL | Status: DC | PRN
  Administered 2021-06-14: 10 mg via ORAL
  Filled 2021-06-14: qty 1

## 2021-06-14 MED ORDER — BUPROPION HCL ER (XL) 300 MG PO TB24
300.0000 mg | ORAL_TABLET | Freq: Every day | ORAL | Status: DC
  Administered 2021-06-15: 300 mg via ORAL
  Filled 2021-06-14: qty 1

## 2021-06-14 MED ORDER — ONDANSETRON HCL 4 MG PO TABS
4.0000 mg | ORAL_TABLET | Freq: Four times a day (QID) | ORAL | Status: DC | PRN
  Filled 2021-06-14: qty 1

## 2021-06-14 MED ORDER — CEFAZOLIN SODIUM-DEXTROSE 2-4 GM/100ML-% IV SOLN
2.0000 g | INTRAVENOUS | Status: AC
  Administered 2021-06-14: 2 g via INTRAVENOUS
  Filled 2021-06-14: qty 100

## 2021-06-14 MED ORDER — PHENYLEPHRINE HCL-NACL 20-0.9 MG/250ML-% IV SOLN
INTRAVENOUS | Status: DC | PRN
  Administered 2021-06-14: 60 ug/min via INTRAVENOUS

## 2021-06-14 MED ORDER — METHOCARBAMOL 1000 MG/10ML IJ SOLN
500.0000 mg | Freq: Four times a day (QID) | INTRAVENOUS | Status: DC | PRN
  Filled 2021-06-14: qty 5

## 2021-06-14 MED ORDER — ACETAMINOPHEN 325 MG PO TABS: 325.0000 mg | ORAL_TABLET | Freq: Four times a day (QID) | ORAL | Status: DC | PRN

## 2021-06-14 MED ORDER — CHLORHEXIDINE GLUCONATE 0.12 % MT SOLN
15.0000 mL | Freq: Once | OROMUCOSAL | Status: AC
  Administered 2021-06-14: 15 mL via OROMUCOSAL
  Filled 2021-06-14: qty 15

## 2021-06-14 MED ORDER — METOCLOPRAMIDE HCL 5 MG PO TABS: 5.0000 mg | ORAL_TABLET | Freq: Three times a day (TID) | ORAL | Status: DC | PRN

## 2021-06-14 MED ORDER — CYANOCOBALAMIN 500 MCG PO TABS
500.0000 ug | ORAL_TABLET | Freq: Every day | ORAL | Status: DC
  Administered 2021-06-15: 500 ug via ORAL
  Filled 2021-06-14 (×2): qty 1

## 2021-06-14 MED ORDER — POVIDONE-IODINE 10 % EX SWAB: 2.0000 "application " | Freq: Once | CUTANEOUS | Status: DC

## 2021-06-14 SURGICAL SUPPLY — 57 items
APL SKNCLS STERI-STRIP NONHPOA (GAUZE/BANDAGES/DRESSINGS)
BAG COUNTER SPONGE SURGICOUNT (BAG) ×2 IMPLANT
BAG SPNG CNTER NS LX DISP (BAG) ×1
BENZOIN TINCTURE PRP APPL 2/3 (GAUZE/BANDAGES/DRESSINGS) ×1 IMPLANT
BLADE CLIPPER SURG (BLADE) IMPLANT
BLADE SAW SGTL 18X1.27X75 (BLADE) ×2 IMPLANT
COVER SURGICAL LIGHT HANDLE (MISCELLANEOUS) ×2 IMPLANT
DRAPE C-ARM 42X72 X-RAY (DRAPES) ×2 IMPLANT
DRAPE STERI IOBAN 125X83 (DRAPES) ×2 IMPLANT
DRAPE U-SHAPE 47X51 STRL (DRAPES) ×6 IMPLANT
DRSG AQUACEL AG ADV 3.5X10 (GAUZE/BANDAGES/DRESSINGS) ×2 IMPLANT
DURAPREP 26ML APPLICATOR (WOUND CARE) ×2 IMPLANT
ELECT BLADE 4.0 EZ CLEAN MEGAD (MISCELLANEOUS) ×2
ELECT BLADE 6.5 EXT (BLADE) IMPLANT
ELECT REM PT RETURN 9FT ADLT (ELECTROSURGICAL) ×2
ELECTRODE BLDE 4.0 EZ CLN MEGD (MISCELLANEOUS) ×1 IMPLANT
ELECTRODE REM PT RTRN 9FT ADLT (ELECTROSURGICAL) ×1 IMPLANT
FACESHIELD WRAPAROUND (MASK) ×4 IMPLANT
FACESHIELD WRAPAROUND OR TEAM (MASK) ×2 IMPLANT
GAUZE XEROFORM 1X8 LF (GAUZE/BANDAGES/DRESSINGS) ×1 IMPLANT
GLOVE SRG 8 PF TXTR STRL LF DI (GLOVE) ×2 IMPLANT
GLOVE SURG LTX SZ8 (GLOVE) ×2 IMPLANT
GLOVE SURG ORTHO LTX SZ7.5 (GLOVE) ×4 IMPLANT
GLOVE SURG UNDER POLY LF SZ8 (GLOVE) ×4
GOWN STRL REUS W/ TWL LRG LVL3 (GOWN DISPOSABLE) ×2 IMPLANT
GOWN STRL REUS W/ TWL XL LVL3 (GOWN DISPOSABLE) ×2 IMPLANT
GOWN STRL REUS W/TWL LRG LVL3 (GOWN DISPOSABLE) ×4
GOWN STRL REUS W/TWL XL LVL3 (GOWN DISPOSABLE) ×4
HANDPIECE INTERPULSE COAX TIP (DISPOSABLE) ×2
HEAD M SROM 36MM PLUS 1.5 (Hips) IMPLANT
KIT BASIN OR (CUSTOM PROCEDURE TRAY) ×2 IMPLANT
KIT TURNOVER KIT B (KITS) ×2 IMPLANT
LINER ACETAB NEUTRAL 36ID 520D (Liner) ×1 IMPLANT
MANIFOLD NEPTUNE II (INSTRUMENTS) ×2 IMPLANT
NS IRRIG 1000ML POUR BTL (IV SOLUTION) ×2 IMPLANT
PACK TOTAL JOINT (CUSTOM PROCEDURE TRAY) ×2 IMPLANT
PAD ARMBOARD 7.5X6 YLW CONV (MISCELLANEOUS) ×2 IMPLANT
PIN SECTOR W/GRIP ACE CUP 52MM (Hips) ×1 IMPLANT
SET HNDPC FAN SPRY TIP SCT (DISPOSABLE) ×1 IMPLANT
SROM M HEAD 36MM PLUS 1.5 (Hips) ×2 IMPLANT
STAPLER VISISTAT 35W (STAPLE) ×1 IMPLANT
STEM FEM ACTIS STD SZ4 (Stem) ×1 IMPLANT
STRIP CLOSURE SKIN 1/2X4 (GAUZE/BANDAGES/DRESSINGS) ×4 IMPLANT
SUT ETHIBOND NAB CT1 #1 30IN (SUTURE) ×2 IMPLANT
SUT MNCRL AB 4-0 PS2 18 (SUTURE) IMPLANT
SUT VIC AB 0 CT1 27 (SUTURE) ×2
SUT VIC AB 0 CT1 27XBRD ANBCTR (SUTURE) ×1 IMPLANT
SUT VIC AB 1 CT1 27 (SUTURE) ×2
SUT VIC AB 1 CT1 27XBRD ANBCTR (SUTURE) ×1 IMPLANT
SUT VIC AB 2-0 CT1 27 (SUTURE) ×2
SUT VIC AB 2-0 CT1 TAPERPNT 27 (SUTURE) ×1 IMPLANT
TOWEL GREEN STERILE (TOWEL DISPOSABLE) ×2 IMPLANT
TOWEL GREEN STERILE FF (TOWEL DISPOSABLE) ×2 IMPLANT
TRAY CATH 16FR W/PLASTIC CATH (SET/KITS/TRAYS/PACK) IMPLANT
TRAY FOLEY W/BAG SLVR 16FR (SET/KITS/TRAYS/PACK) ×2
TRAY FOLEY W/BAG SLVR 16FR ST (SET/KITS/TRAYS/PACK) IMPLANT
WATER STERILE IRR 1000ML POUR (IV SOLUTION) ×4 IMPLANT

## 2021-06-14 NOTE — Anesthesia Procedure Notes (Signed)
Spinal  Patient location during procedure: OR Start time: 06/14/2021 9:43 AM End time: 06/14/2021 9:46 AM Reason for block: surgical anesthesia Staffing Performed: anesthesiologist  Anesthesiologist: Kaylyn Layer, MD Preanesthetic Checklist Completed: patient identified, IV checked, risks and benefits discussed, surgical consent, monitors and equipment checked, pre-op evaluation and timeout performed Spinal Block Patient position: sitting Prep: DuraPrep and site prepped and draped Patient monitoring: continuous pulse ox, blood pressure and heart rate Approach: midline Location: L3-4 Injection technique: single-shot Needle Needle type: Pencan  Needle gauge: 24 G Needle length: 9 cm Assessment Events: CSF return Additional Notes Risks, benefits, and alternative discussed. Patient gave consent to procedure. Prepped and draped in sitting position. Patient sedated but responsive to voice. Clear CSF obtained after one needle redirection. Positive terminal aspiration. No pain or paraesthesias with injection. Patient tolerated procedure well. Vital signs stable. Amalia Greenhouse, MD

## 2021-06-14 NOTE — H&P (Signed)
TOTAL HIP ADMISSION H&P  Patient is admitted for right total hip arthroplasty.  Subjective:  Chief Complaint: right hip pain  HPI: [name], 71 y.o. female, has a history of pain and functional disability in the right hip(s) due to arthritis and patient has failed non-surgical conservative treatments for greater than 12 weeks to include NSAID's and/or analgesics, corticosteriod injections, use of assistive devices, weight reduction as appropriate, and activity modification.  Onset of symptoms was gradual starting 2 years ago with gradually worsening course since that time.The patient noted no past surgery on the right hip(s).  Patient currently rates pain in the right hip at 9 out of 10 with activity. Patient has night pain, worsening of pain with activity and weight bearing, pain that interfers with activities of daily living, and pain with passive range of motion. Patient has evidence of subchondral sclerosis, periarticular osteophytes, and joint space narrowing by imaging studies. This condition presents safety issues increasing the risk of falls.  There is no current active infection.  Patient Active Problem List   Diagnosis Date Noted   Primary osteoarthritis of right hip 08/02/2020   Status post total right knee replacement 05/18/2020   Status post total left knee replacement 12/16/2019   Unilateral primary osteoarthritis, left knee 10/29/2019   Unilateral primary osteoarthritis, right knee 10/29/2019   Past Medical History:  Diagnosis Date   Anxiety    Arthritis    COPD (chronic obstructive pulmonary disease) (HCC)    Depression    GERD (gastroesophageal reflux disease)    History of hiatal hernia    RLS (restless legs syndrome)     Past Surgical History:  Procedure Laterality Date   BREAST EXCISIONAL BIOPSY Right 1990   X2 -benign   BREAST SURGERY     lumpectomy   CHOLECYSTECTOMY     JOINT REPLACEMENT     TONSILLECTOMY     TOTAL KNEE ARTHROPLASTY Left 12/16/2019    Procedure: LEFT TOTAL KNEE ARTHROPLASTY;  Surgeon: Mcarthur Rossetti, MD;  Location: [facility];  Service: Orthopedics;  Laterality: Left;   TOTAL KNEE ARTHROPLASTY Right 05/18/2020   Procedure: RIGHT TOTAL KNEE ARTHROPLASTY;  Surgeon: Mcarthur Rossetti, MD;  Location: [facility];  Service: Orthopedics;  Laterality: Right;    Current Facility-Administered Medications  Medication Dose Route Frequency Provider Last Rate Last Admin   acetaminophen (TYLENOL) tablet 1,000 mg  1,000 mg Oral Once Brennan Bailey, MD       ceFAZolin (ANCEF) IVPB 2g/100 mL premix  2 g Intravenous On Call to OR Pete Pelt, PA-C       chlorhexidine (PERIDEX) 0.12 % solution 15 mL  15 mL Mouth/Throat Once Santa Lighter, MD       Or   MEDLINE mouth rinse  15 mL Mouth Rinse Once Santa Lighter, MD       lactated ringers infusion   Intravenous Continuous Santa Lighter, MD       povidone-iodine 10 % swab 2 application  2 application Topical Once Pete Pelt, PA-C       tranexamic acid (CYKLOKAPRON) IVPB 1,000 mg  1,000 mg Intravenous To OR Pete Pelt, PA-C       Current Outpatient Medications  Medication Sig Dispense Refill Last Dose   baclofen (LIORESAL) 10 MG tablet Take 1 tablet (10 mg total) by mouth 3 (three) times daily as needed for muscle spasms. 30 each 0    buPROPion (WELLBUTRIN XL) 300 MG 24 hr tablet Take 300  mg by mouth daily.      Cholecalciferol (VITAMIN D) 125 MCG (5000 UT) CAPS Take 5,000 Units by mouth daily.      CINNAMON PO Take 1,500 mg by mouth 2 (two) times daily.      Coenzyme Q10 (CO Q 10) 100 MG CAPS Take by mouth.      gabapentin (NEURONTIN) 100 MG capsule TAKE 1 CAPSULE BY MOUTH 3  TIMES DAILY (Patient taking differently: 100 mg daily as needed (pain).) 90 capsule 11    HYDROcodone-acetaminophen (NORCO/VICODIN) 5-325 MG tablet Take 1 tablet by mouth 3 (three) times daily as needed for pain.      LORazepam (ATIVAN) 0.5 MG tablet Take 0.5 mg by mouth 3 (three) times  daily as needed for anxiety.      meloxicam (MOBIC) 15 MG tablet Take 15 mg by mouth daily.      Misc Natural Products (ELDERBERRY ZINC/VIT C/IMMUNE MT) Take 2 tablets by mouth at bedtime. With Vit D      pantoprazole (PROTONIX) 40 MG tablet Take 40 mg by mouth daily.      rOPINIRole (REQUIP) 1 MG tablet Take 1 mg by mouth in the morning and at bedtime.      rosuvastatin (CRESTOR) 10 MG tablet Take 10 mg by mouth daily.      vitamin B-12 (CYANOCOBALAMIN) 500 MCG tablet Take 500 mcg by mouth daily.      Allergies  Allergen Reactions   Penicillins Other (See Comments)    Childhood possible joint swell    Social History   Tobacco Use   Smoking status: Former    Packs/day: 0.50    Years: 40.00    Pack years: 20.00    Types: Cigarettes    Quit date: 09/17/2019    Years since quitting: 1.7   Smokeless tobacco: Never  Substance Use Topics   Alcohol use: Never    Family History  Problem Relation Age of Onset   Hypertension Mother    Healthy Sister    Breast cancer Other      Review of Systems  Musculoskeletal:  Positive for gait problem.  All other systems reviewed and are negative.  Objective:  Physical Exam Vitals reviewed.  Constitutional:      Appearance: Normal appearance.  HENT:     Head: Normocephalic and atraumatic.  Eyes:     Extraocular Movements: Extraocular movements intact.     Pupils: Pupils are equal, round, and reactive to light.  Cardiovascular:     Rate and Rhythm: Normal rate and regular rhythm.     Pulses: Normal pulses.  Pulmonary:     Effort: Pulmonary effort is normal.     Breath sounds: Normal breath sounds.  Abdominal:     Palpations: Abdomen is soft.  Musculoskeletal:     Cervical back: Normal range of motion and neck supple.     Right hip: Tenderness and bony tenderness present. Decreased range of motion. Decreased strength.  Neurological:     Mental Status: She is alert and oriented to person, place, and time.  Psychiatric:         Behavior: Behavior normal.    Vital signs in last 24 hours:    Labs:   Estimated body mass index is 31.46 kg/m as calculated from the following:   Height as of 06/10/21: 5\' 4"  (1.626 m).   Weight as of 06/10/21: 83.1 kg.   Imaging Review Plain radiographs demonstrate severe degenerative joint disease of the right hip(s). The bone quality  appears to be good for age and reported activity level.      Assessment/Plan:  End stage arthritis, right hip(s)  The patient history, physical examination, clinical judgement of the provider and imaging studies are consistent with end stage degenerative joint disease of the right hip(s) and total hip arthroplasty is deemed medically necessary. The treatment options including medical management, injection therapy, arthroscopy and arthroplasty were discussed at length. The risks and benefits of total hip arthroplasty were presented and reviewed. The risks due to aseptic loosening, infection, stiffness, dislocation/subluxation,  thromboembolic complications and other imponderables were discussed.  The patient acknowledged the explanation, agreed to proceed with the plan and consent was signed. Patient is being admitted for inpatient treatment for surgery, pain control, PT, OT, prophylactic antibiotics, VTE prophylaxis, progressive ambulation and ADL's and discharge planning.The patient is planning to be discharged home with home health services

## 2021-06-14 NOTE — Anesthesia Postprocedure Evaluation (Signed)
Anesthesia Post Note  Patient: [name]  Procedure(s) Performed: Right TOTAL HIP ARTHROPLASTY ANTERIOR APPROACH (Right: Hip)     Patient location during evaluation: PACU Anesthesia Type: Spinal Level of consciousness: awake and alert Pain management: pain level controlled Vital Signs Assessment: post-procedure vital signs reviewed and stable Respiratory status: spontaneous breathing, nonlabored ventilation and respiratory function stable Cardiovascular status: blood pressure returned to baseline Postop Assessment: no apparent nausea or vomiting, spinal receding, no headache and no backache Anesthetic complications: no   No notable events documented.  Last Vitals:  Vitals:   06/14/21 1155 06/14/21 1210  BP: 111/68 115/67  Pulse: 69 65  Resp: 20 16  Temp:  36.6 C  SpO2: 99% 97%    Last Pain:  Vitals:   06/14/21 1210  TempSrc:   PainSc: 0-No pain                 Shanda Howells

## 2021-06-14 NOTE — Transfer of Care (Signed)
Immediate Anesthesia Transfer of Care Note  Patient: [name]  Procedure(s) Performed: Right TOTAL HIP ARTHROPLASTY ANTERIOR APPROACH (Right: Hip)  Patient Location: PACU  Anesthesia Type:Spinal  Level of Consciousness: awake, alert  and oriented  Airway & Oxygen Therapy: Patient Spontanous Breathing  Post-op Assessment: Report given to RN, Post -op Vital signs reviewed and stable and Patient able to stick tongue midline  Post vital signs: Reviewed  Last Vitals:  Vitals Value Taken Time  BP 98/54   Temp 97.4   Pulse 49   Resp 15   SpO2 100     Last Pain:  Vitals:   06/14/21 0809  TempSrc:   PainSc: 0-No pain         Complications: No notable events documented.

## 2021-06-14 NOTE — Interval H&P Note (Signed)
History and Physical Interval Note: The patient understands fully that she is here today for a right hip replacement to treat her right hip osteoarthritis.  There has been no acute or interval change in her medical status.  Please see recent H&P.  The risks and benefits of surgery been explained in detail and informed consent is obtained.  The right hip has been marked.  06/14/2021 9:15 AM  [name]  has presented today for surgery, with the diagnosis of Osteoarthritis / Degenerative joint diease Right hip.  The various methods of treatment have been discussed with the patient and family. After consideration of risks, benefits and other options for treatment, the patient has consented to  Procedure(s): Right TOTAL HIP ARTHROPLASTY ANTERIOR APPROACH (Right) as a surgical intervention.  The patient's history has been reviewed, patient examined, no change in status, stable for surgery.  I have reviewed the patient's chart and labs.  Questions were answered to the patient's satisfaction.     Mcarthur Rossetti

## 2021-06-14 NOTE — Op Note (Signed)
NAMECLASSIE, WENG MEDICAL RECORD NO: [id number] ACCOUNT NO: [id number] DATE OF BIRTH: [birth year] FACILITY: [facility] LOCATION: [facility] PHYSICIAN: Vanita Panda. Magnus Ivan, MD  Operative Report   DATE OF PROCEDURE: 06/14/2021  PREOPERATIVE DIAGNOSES:  Primary osteoarthritis and degenerative joint disease, right hip.  POSTOPERATIVE DIAGNOSES:  Primary osteoarthritis and degenerative joint disease, right hip.  PROCEDURE:  Right total hip arthroplasty through direct anterior approach.  IMPLANTS:  DePuy sector Gription acetabular component size 52, size 36+0 neutral polyethylene liner, size 4 ACTIS femoral component with standard offset, size 36+1.5 metal hip ball.  SURGEON:  Vanita Panda. Magnus Ivan, MD.  ASSISTANT: Rexene Edison, PA-C.  ANESTHESIA:  Spinal.  ANTIBIOTICS:  2 g IV Ancef.  ESTIMATED BLOOD LOSS:  400 mL.  COMPLICATIONS:  None.  INDICATIONS:  The patient is a 71 year old female well known to me.  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  She now has developed debilitating arthritis of her right hip, it has been well documented with clinical exam and x-ray findings.  She has had  an intra-articular hip injection with a steroid, which did help for a while and temporized her pain, but her pain is back and it is daily.  Her right hip pain is detrimentally affecting her mobility, her quality of life and activities of daily living to  the point she does wish to proceed with a total hip arthroplasty on the right side and we have recommended this as well.  We did talk in length and detail about the risk of acute blood loss anemia, nerve or vessel injury, fracture, infection, DVT,  implant failure, dislocation, leg length differences and skin and soft tissue issues.  We talked about our goals being decreased pain, improve mobility and overall improve quality of life.  DESCRIPTION OF PROCEDURE:  After informed consent was obtained, appropriate right hip was marked.  She was brought to the  operating room and sat up on the stretcher where spinal anesthesia was obtained.  She was laid in supine position on the stretcher.   Foley catheter was placed and traction boots were placed on both her feet.  Next, she was placed supine on the Hana fracture table.  The perineal post in place and both legs in line skeletal traction device and no traction applied.  Her right operative  hip was prepped and draped with DuraPrep and sterile drapes.  A timeout was called and she was identified as correct patient, correct right hip.  I then made an incision just inferior and posterior to the anterior superior iliac spine and carried this  obliquely down the leg.  We dissected down tensor fascia lata muscle.  Tensor fascia was then divided longitudinally to proceed with direct anterior approach to the hip.  We identified and cauterized circumflex vessels and then identified the hip  capsule, I opened up the hip capsule in L-type format finding a moderate joint effusion.  We placed curved retractors around the medial and lateral femoral neck and made a femoral neck cut with oscillating saw just proximal to the lesser trochanter and  completed this with an osteotome.  I placed a corkscrew guide in the femoral head and removed the femoral head in its entirety and found a wide area devoid of cartilage.  I then placed a bent Hohmann over the medial acetabular rim and removed remnants of  acetabular labrum and other debris.  I then began reaming under direct visualization from a size 43 reamer in a stepwise increments going up to a size  51 reamer with all reamers placed under direct visualization, the last reamer was placed under direct  fluoroscopy, so we could obtain our depth of reaming, our inclination and anteversion.  I then placed real DePuy sector Gription acetabular component size 52.  We went with a 36+0 polyethylene liner based on offset.  Attention was then turned to the  femur.  With the leg externally  rotated to 120 degrees, extended, and, adducted, we were able to place a Mueller retractor medially and Hohman retractor behind the greater trochanter.  We released lateral joint capsule and used a box cutting osteotome to  enter femoral canal and a rongeur to lateralize and then began broaching using the ACTIS broaching system from a size 0 going up to a size 4.  With the size 4 in place, we trialed a standard offset femoral neck and a 36+1.5 hip ball.  We reduced the  trial components into the acetabulum.  We were pleased with leg length, offset, range of motion and stability assessed mechanically and radiographically.  We then dislocated the hip and removed the trial components.  We placed the real ACTIS femoral  component with standard offset size 4 and the real 36+1.5 metal hip ball, again reduced this in acetabulum.  We appreciate stability.  We assessed it again mechanically and radiographically.  We then irrigated the soft tissue with normal saline solution  using pulsatile lavage.  We closed the joint capsule with interrupted #1 Ethibond suture followed by #1 Vicryl to close the tensor fascia.  0 Vicryl was used to close the deep tissue and 2-0 Vicryl was used to close subcutaneous tissue.  The skin was  closed with staples.  An Aquacel dressing was applied.  She was taken off the Hana table and taken to recovery room in stable condition with all final counts being correct.  There were no complications noted.    Of note, Rexene Edison, PA-C did assist in the entire case and assistance was crucial for facilitating all aspects of this case.   CHR D: 06/14/2021 10:52:20 am T: 06/14/2021 3:00:00 pm  JOB: [id number]/ [id number]

## 2021-06-14 NOTE — Brief Op Note (Signed)
06/14/2021  10:53 AM  PATIENT:  [name]  71 y.o. female  PRE-OPERATIVE DIAGNOSIS:  Osteoarthritis / Degenerative joint diease Right hip  POST-OPERATIVE DIAGNOSIS:  Osteoarthritis / Degenerative joint diease Right hip  PROCEDURE:  Procedure(s): Right TOTAL HIP ARTHROPLASTY ANTERIOR APPROACH (Right)  SURGEON:  Surgeon(s) and Role:    Kathryne Hitch, MD - Primary  PHYSICIAN ASSISTANT:  Rexene Edison, PA-C  ANESTHESIA:   spinal  EBL:  400 mL   COUNTS:  YES  DICTATION: .Other Dictation: Dictation Number [id number]  PLAN OF CARE: Admit for overnight observation  PATIENT DISPOSITION:  PACU - hemodynamically stable.   Delay start of Pharmacological VTE agent (>24hrs) due to surgical blood loss or risk of bleeding: no

## 2021-06-14 NOTE — Evaluation (Signed)
Physical Therapy Evaluation Patient Details Name: [name] MRN: [id number] DOB: 07/16/1950 Today's Date: 06/14/2021  History of Present Illness  Pt is a 71 y/o female s/p R THA, direct anterior. PMH includes bilateral TKA and COPD.  Clinical Impression  Pt is s/p surgery above with deficits below. Gait limited to room secondary to pain. Requiring min to min guard A for mobility tasks this session. Reports daughter will be able to assist as needed. Will continue to follow acutely.        Recommendations for follow up therapy are one component of a multi-disciplinary discharge planning process, led by the attending physician.  Recommendations may be updated based on patient status, additional functional criteria and insurance authorization.  Follow Up Recommendations Follow physician's recommendations for discharge plan and follow up therapies    Assistance Recommended at Discharge Intermittent Supervision/Assistance  Patient can return home with the following       Equipment Recommendations None recommended by PT  Recommendations for Other Services       Functional Status Assessment Patient has had a recent decline in their functional status and demonstrates the ability to make significant improvements in function in a reasonable and predictable amount of time.     Precautions / Restrictions Precautions Precautions: None Restrictions Weight Bearing Restrictions: Yes RLE Weight Bearing: Weight bearing as tolerated      Mobility  Bed Mobility Overal bed mobility: Needs Assistance Bed Mobility: Supine to Sit     Supine to sit: Min assist     General bed mobility comments: Required assist for RLE management. Increased time required    Transfers Overall transfer level: Needs assistance Equipment used: Rolling walker (2 wheels) Transfers: Sit to/from Stand Sit to Stand: Min guard           General transfer comment: Min guard for safety     Ambulation/Gait Ambulation/Gait assistance: Min guard Gait Distance (Feet): 15 Feet Assistive device: Rolling walker (2 wheels) Gait Pattern/deviations: Step-to pattern, Decreased step length - right, Decreased step length - left, Decreased weight shift to right, Antalgic Gait velocity: Decreased     General Gait Details: Slow, antalgic gait. Min guard for safety. Cues for sequencing. Distance limited secondary to pain.  Stairs            Wheelchair Mobility    Modified Rankin (Stroke Patients Only)       Balance Overall balance assessment: Needs assistance Sitting-balance support: No upper extremity supported, Feet supported Sitting balance-Leahy Scale: Good     Standing balance support: Bilateral upper extremity supported Standing balance-Leahy Scale: Poor Standing balance comment: Reliant on BUE support                             Pertinent Vitals/Pain Pain Assessment Pain Assessment: 0-10 Pain Score: 8  Pain Location: R hip Pain Descriptors / Indicators: Grimacing, Guarding, Operative site guarding Pain Intervention(s): Limited activity within patient's tolerance, Monitored during session, Repositioned    Home Living Family/patient expects to be discharged to:: Private residence Living Arrangements: Alone Available Help at Discharge: Family;Available 24 hours/day Type of Home: Apartment Home Access: Level entry       Home Layout: One level Home Equipment: Advice worker (2 wheels);BSC/3in1;Cane - single point      Prior Function Prior Level of Function : Independent/Modified Independent             Mobility Comments: Was using cane occasionally  Hand Dominance        Extremity/Trunk Assessment   Upper Extremity Assessment Upper Extremity Assessment: Overall WFL for tasks assessed    Lower Extremity Assessment Lower Extremity Assessment: RLE deficits/detail RLE Deficits / Details: Deficits consistent  with post op pain and weakness.    Cervical / Trunk Assessment Cervical / Trunk Assessment: Normal  Communication   Communication: No difficulties  Cognition Arousal/Alertness: Awake/alert Behavior During Therapy: WFL for tasks assessed/performed Overall Cognitive Status: Within Functional Limits for tasks assessed                                          General Comments      Exercises Total Joint Exercises Ankle Circles/Pumps: AROM, Both, 10 reps   Assessment/Plan    PT Assessment Patient needs continued PT services  PT Problem List Decreased strength;Decreased activity tolerance;Decreased range of motion;Decreased balance;Decreased mobility;Pain       PT Treatment Interventions DME instruction;Gait training;Stair training;Therapeutic activities;Functional mobility training;Balance training;Therapeutic exercise;Patient/family education    PT Goals (Current goals can be found in the Care Plan section)  Acute Rehab PT Goals Patient Stated Goal: to go home PT Goal Formulation: With patient Time For Goal Achievement: 06/28/21 Potential to Achieve Goals: Good    Frequency 7X/week     Co-evaluation               AM-PAC PT "6 Clicks" Mobility  Outcome Measure Help needed turning from your back to your side while in a flat bed without using bedrails?: A Little Help needed moving from lying on your back to sitting on the side of a flat bed without using bedrails?: A Little Help needed moving to and from a bed to a chair (including a wheelchair)?: A Little Help needed standing up from a chair using your arms (e.g., wheelchair or bedside chair)?: A Little Help needed to walk in hospital room?: A Little Help needed climbing 3-5 steps with a railing? : A Lot 6 Click Score: 17    End of Session Equipment Utilized During Treatment: Gait belt Activity Tolerance: Patient limited by pain Patient left: in chair;with call bell/phone within reach Nurse  Communication: Mobility status PT Visit Diagnosis: Other abnormalities of gait and mobility (R26.89);Pain Pain - Right/Left: Right Pain - part of body: Hip    Time: [id number] PT Time Calculation (min) (ACUTE ONLY): 16 min   Charges:   PT Evaluation $PT Eval Low Complexity: 1 Low          Lou Miner, DPT  Acute Rehabilitation Services  Pager: [phone] Office: [phone]   Rudean Hitt 06/14/2021, 3:28 PM

## 2021-06-15 ENCOUNTER — Ambulatory Visit: Payer: Medicare Other | Admitting: Orthopaedic Surgery

## 2021-06-15 ENCOUNTER — Encounter (HOSPITAL_COMMUNITY): Payer: Self-pay | Admitting: Orthopaedic Surgery

## 2021-06-15 DIAGNOSIS — M1611 Unilateral primary osteoarthritis, right hip: Secondary | ICD-10-CM | POA: Diagnosis not present

## 2021-06-15 LAB — BASIC METABOLIC PANEL
Anion gap: 7 (ref 5–15)
BUN: 12 mg/dL (ref 8–23)
CO2: 31 mmol/L (ref 22–32)
Calcium: 9.3 mg/dL (ref 8.9–10.3)
Chloride: 102 mmol/L (ref 98–111)
Creatinine, Ser: 1.13 mg/dL — ABNORMAL HIGH (ref 0.44–1.00)
GFR, Estimated: 52 mL/min — ABNORMAL LOW (ref >60)
Glucose, Bld: 126 mg/dL — ABNORMAL HIGH (ref 70–99)
Potassium: 4.2 mmol/L (ref 3.5–5.1)
Sodium: 140 mmol/L (ref 135–145)

## 2021-06-15 LAB — CBC
HCT: 34.1 % — ABNORMAL LOW (ref 36.0–46.0)
Hemoglobin: 11.5 g/dL — ABNORMAL LOW (ref 12.0–15.0)
MCH: 31.5 pg (ref 26.0–34.0)
MCHC: 33.7 g/dL (ref 30.0–36.0)
MCV: 93.4 fL (ref 80.0–100.0)
Platelets: UNDETERMINED 10*3/uL (ref 150–400)
RBC: 3.65 MIL/uL — ABNORMAL LOW (ref 3.87–5.11)
RDW: 12.6 % (ref 11.5–15.5)
WBC: 9.9 10*3/uL (ref 4.0–10.5)
nRBC: 0 % (ref 0.0–0.2)

## 2021-06-15 NOTE — TOC Progression Note (Addendum)
Transition of Care [facility]) - Progression Note    Patient Details  Name: [name] MRN: [id number] Date of Birth: [birth year]  [facility]) CM/SW Contact  Nadene Rubins Adria Devon, RN Phone Number: 06/15/2021, 10:00 AM  Clinical Narrative:     Orders for HHPT. Patient has had [facility] in past and would like them again.   NCM called [facility] spoke to Arna Medici, they are confirming insurance. Chip Boer with [facility] accepted referral . Clinicals faxed to [facility] at [phone]        Expected Discharge Plan and Services           Expected Discharge Date: 06/15/21                                     Social Determinants of Health (SDOH) Interventions    Readmission Risk Interventions No flowsheet data found.

## 2021-06-15 NOTE — Discharge Summary (Signed)
Patient ID: [name] MRN: [id number] DOB/AGE: May 25, 1950 71 y.o.  Admit date: 06/14/2021 Discharge date: 06/15/2021  Admission Diagnoses:  Principal Problem:   Primary osteoarthritis of right hip Active Problems:   Status post total replacement of right hip   Discharge Diagnoses:  Same  Past Medical History:  Diagnosis Date   Anxiety    Arthritis    COPD (chronic obstructive pulmonary disease) (HCC)    Depression    GERD (gastroesophageal reflux disease)    History of hiatal hernia    RLS (restless legs syndrome)     Surgeries: Procedure(s): Right TOTAL HIP ARTHROPLASTY ANTERIOR APPROACH on 06/14/2021   Consultants:   Discharged Condition: Improved  Hospital Course: [name] is an 71 y.o. female who was admitted 06/14/2021 for operative treatment ofPrimary osteoarthritis of right hip. Patient has severe unremitting pain that affects sleep, daily activities, and work/hobbies. After pre-op clearance the patient was taken to the operating room on 06/14/2021 and underwent  Procedure(s): Right TOTAL HIP ARTHROPLASTY ANTERIOR APPROACH.    Patient was given perioperative antibiotics:  Anti-infectives (From admission, onward)    Start     Dose/Rate Route Frequency Ordered Stop   06/14/21 1600  ceFAZolin (ANCEF) IVPB 1 g/50 mL premix        1 g 100 mL/hr over 30 Minutes Intravenous Every 6 hours 06/14/21 1126 06/14/21 2235   06/14/21 0645  ceFAZolin (ANCEF) IVPB 2g/100 mL premix        2 g 200 mL/hr over 30 Minutes Intravenous On call to O.R. 06/14/21 [id number] 06/14/21 0948        Patient was given sequential compression devices, early ambulation, and chemoprophylaxis to prevent DVT.  Patient benefited maximally from hospital stay and there were no complications.    Recent vital signs: Patient Vitals for the past 24 hrs:  BP Temp Temp src Pulse Resp SpO2  06/15/21 0716 120/65 98.2 F (36.8 C) Oral 83 18 98 %  06/15/21 0419 106/61 98.4 F (36.9 C) Oral 79 18 97 %   06/14/21 2306 111/65 98.2 F (36.8 C) Oral 84 18 96 %  06/14/21 1929 116/76 98.3 F (36.8 C) Oral 90 18 95 %  06/14/21 1605 123/76 98.2 F (36.8 C) Oral 81 18 98 %  06/14/21 1248 115/63 98 F (36.7 C) -- 69 20 100 %  06/14/21 1210 115/67 97.8 F (36.6 C) -- 65 16 97 %  06/14/21 1155 111/68 -- -- 69 20 99 %  06/14/21 1140 100/70 -- -- 65 14 99 %  06/14/21 1125 (!) 91/57 -- -- 69 19 98 %  06/14/21 1110 (!) 98/54 97.6 F (36.4 C) -- 72 16 97 %     Recent laboratory studies:  Recent Labs    06/15/21 0620  WBC 9.9  HGB 11.5*  HCT 34.1*  PLT PLATELET CLUMPS NOTED ON SMEAR, UNABLE TO ESTIMATE  NA 140  K 4.2  CL 102  CO2 31  BUN 12  CREATININE 1.13*  GLUCOSE 126*  CALCIUM 9.3     Discharge Medications:   Allergies as of 06/15/2021       Reactions   Penicillins Other (See Comments)   Childhood possible joint swell        Medication List     STOP taking these medications    HYDROcodone-acetaminophen 5-325 MG tablet Commonly known as: NORCO/VICODIN       TAKE these medications    baclofen 10 MG tablet Commonly known as: LIORESAL Take 1 tablet (  10 mg total) by mouth 3 (three) times daily as needed for muscle spasms.   buPROPion 300 MG 24 hr tablet Commonly known as: WELLBUTRIN XL Take 300 mg by mouth daily.   CINNAMON PO Take 1,500 mg by mouth 2 (two) times daily.   Co Q 10 100 MG Caps Take by mouth.   ELDERBERRY ZINC/VIT C/IMMUNE MT Take 2 tablets by mouth at bedtime. With Vit D   gabapentin 100 MG capsule Commonly known as: NEURONTIN TAKE 1 CAPSULE BY MOUTH 3  TIMES DAILY What changed:  how to take this when to take this reasons to take this   LORazepam 0.5 MG tablet Commonly known as: ATIVAN Take 0.5 mg by mouth 3 (three) times daily as needed for anxiety.   meloxicam 15 MG tablet Commonly known as: MOBIC Take 15 mg by mouth daily.   oxyCODONE-acetaminophen 5-325 MG tablet Commonly known as: PERCOCET/ROXICET Take 1-2 tablets by  mouth every 6 (six) hours as needed for moderate pain.   pantoprazole 40 MG tablet Commonly known as: PROTONIX Take 40 mg by mouth daily.   rOPINIRole 1 MG tablet Commonly known as: REQUIP Take 1 mg by mouth in the morning and at bedtime.   rosuvastatin 10 MG tablet Commonly known as: CRESTOR Take 10 mg by mouth daily.   vitamin B-12 500 MCG tablet Commonly known as: CYANOCOBALAMIN Take 500 mcg by mouth daily.   Vitamin D 125 MCG (5000 UT) Caps Take 5,000 Units by mouth daily.               Durable Medical Equipment  (From admission, onward)           Start     Ordered   06/14/21 1246  DME 3 n 1  Once        06/14/21 1245   06/14/21 1246  DME Walker rolling  Once       Question Answer Comment  Walker: With 5 Inch Wheels   Patient needs a walker to treat with the following condition Status post total replacement of right hip      06/14/21 1245            Diagnostic Studies: DG Pelvis Portable  Result Date: 06/14/2021 CLINICAL DATA:  Postop right anterior hip replacement. EXAM: PORTABLE PELVIS 1-2 VIEWS COMPARISON:  Radiographs 08/02/2020. Intraoperative views earlier the same date. FINDINGS: 1103 hours. AP view of the lower pelvis demonstrates right total hip replacement in good position. No evidence of acute fracture or dislocation. There is a small amount of gas within the right hip joint and surrounding soft tissues. There are mild left hip degenerative changes. IMPRESSION: Status post right total hip arthroplasty without demonstrated complication. Electronically Signed   By: Carey Bullocks M.D.   On: 06/14/2021 11:44   DG C-Arm 1-60 Min-No Report  Result Date: 06/14/2021 Fluoroscopy was utilized by the requesting physician.  No radiographic interpretation.   DG HIP UNILAT WITH PELVIS 1V RIGHT  Result Date: 06/14/2021 CLINICAL DATA:  Postop right anterior hip replacement. EXAM: DG HIP (WITH OR WITHOUT PELVIS) 1V RIGHT COMPARISON:  Radiographs 08/02/2020.  FINDINGS: C-arm fluoroscopy provided in the operating room. 25 seconds of fluoroscopy time. 2.47 mGy. 4 spot fluoroscopic images demonstrate interval right total hip arthroplasty without apparent complication. The hardware is well positioned. There is no evidence of acute fracture or dislocation. There is gas within the surgical bed. IMPRESSION: Intraoperative views following right total hip arthroplasty. No demonstrated complication. Electronically Signed   By:  Carey Bullocks M.D.   On: 06/14/2021 11:43    Disposition: Discharge disposition: 06-Home-Health Care Svc          Follow-up Information     Kathryne Hitch, MD. Schedule an appointment as soon as possible for a visit in 1 week(s).   Specialty: Orthopedic Surgery Contact information: [address] [place] [address] [phone]         Kathryne Hitch, MD .   Specialty: Orthopedic Surgery Contact information: [address] [place] [address] [phone]                  Signed: Richardean Canal 06/15/2021, 9:08 AM

## 2021-06-15 NOTE — Progress Notes (Signed)
Physical Therapy Treatment Patient Details Name: [name] MRN: [id number] DOB: [birth year] Today's Date: 06/15/2021   History of Present Illness Pt is a 71 y/o female s/p R THA, direct anterior. PMH includes bilateral TKA and COPD.    PT Comments    Patient progressing towards physical therapy goals. Patient ambulating 300' with RW and supervision, cues for heel strike. Instructed patient on HEP handout for continued LE strengthening at discharge. Patient has family support at discharge. D/c plan remains appropriate.     Recommendations for follow up therapy are one component of a multi-disciplinary discharge planning process, led by the attending physician.  Recommendations may be updated based on patient status, additional functional criteria and insurance authorization.  Follow Up Recommendations  Follow physician's recommendations for discharge plan and follow up therapies     Assistance Recommended at Discharge Intermittent Supervision/Assistance  Patient can return home with the following     Equipment Recommendations  None recommended by PT    Recommendations for Other Services       Precautions / Restrictions Precautions Precautions: None Restrictions Weight Bearing Restrictions: Yes RLE Weight Bearing: Weight bearing as tolerated     Mobility  Bed Mobility               General bed mobility comments: sitting EOB on arrival    Transfers Overall transfer level: Needs assistance Equipment used: Rolling [name] (2 wheels) Transfers: Sit to/from Stand Sit to Stand: Supervision           General transfer comment: supervision for safety    Ambulation/Gait Ambulation/Gait assistance: Supervision Gait Distance (Feet): 300 Feet Assistive device: Rolling [name] (2 wheels) Gait Pattern/deviations: Step-to pattern, Decreased step length - right, Decreased step length - left, Decreased weight shift to right, Antalgic Gait velocity: Decreased Gait  velocity interpretation: >2.62 ft/sec, indicative of community ambulatory   General Gait Details: slow antalgic gait pattern but steady. Cues for heel strike   Stairs             Wheelchair Mobility    Modified Rankin (Stroke Patients Only)       Balance Overall balance assessment: Needs assistance Sitting-balance support: No upper extremity supported, Feet supported Sitting balance-Leahy Scale: Good     Standing balance support: No upper extremity supported Standing balance-Leahy Scale: Fair Standing balance comment: static standing with no UE support                            Cognition Arousal/Alertness: Awake/alert Behavior During Therapy: WFL for tasks assessed/performed Overall Cognitive Status: Within Functional Limits for tasks assessed                                          Exercises      General Comments General comments (skin integrity, edema, etc.): Provided HEP handout to patient and instructed patient on each exercise and frequency      Pertinent Vitals/Pain Pain Assessment Pain Assessment: Faces Faces Pain Scale: Hurts little more Pain Location: R hip Pain Descriptors / Indicators: Grimacing, Guarding, Operative site guarding Pain Intervention(s): Monitored during session, Repositioned    Home Living                          Prior Function  PT Goals (current goals can now be found in the care plan section) Acute Rehab PT Goals Patient Stated Goal: to go home PT Goal Formulation: With patient Time For Goal Achievement: 06/28/21 Potential to Achieve Goals: Good Progress towards PT goals: Progressing toward goals    Frequency    7X/week      PT Plan Current plan remains appropriate    Co-evaluation              AM-PAC PT "6 Clicks" Mobility   Outcome Measure  Help needed turning from your back to your side while in a flat bed without using bedrails?: A Little Help  needed moving from lying on your back to sitting on the side of a flat bed without using bedrails?: A Little Help needed moving to and from a bed to a chair (including a wheelchair)?: A Little Help needed standing up from a chair using your arms (e.g., wheelchair or bedside chair)?: A Little Help needed to walk in hospital room?: A Little Help needed climbing 3-5 steps with a railing? : A Lot 6 Click Score: 17    End of Session   Activity Tolerance: Patient tolerated treatment well Patient left: in chair;with call bell/phone within reach Nurse Communication: Mobility status PT Visit Diagnosis: Other abnormalities of gait and mobility (R26.89);Pain Pain - Right/Left: Right Pain - part of body: Hip     Time: 7169-6789 PT Time Calculation (min) (ACUTE ONLY): 26 min  Charges:  $Gait Training: 8-22 mins $Therapeutic Exercise: 8-22 mins                     Jonnette Nuon A. Dan Humphreys PT, DPT Acute Rehabilitation Services Pager [phone] Office [phone]    Viviann Spare 06/15/2021, 9:01 AM

## 2021-06-15 NOTE — Plan of Care (Signed)
Patient alert and oriented, mae's well, voiding adequate amount of urine, swallowing without difficulty, no c/o pain at time of discharge. Patient discharged home with family. Script and discharged instructions given to patient. Patient and family stated understanding of instructions given. Patient has an appointment with Dr. Blackman in 2 weeks 

## 2021-06-15 NOTE — Discharge Instructions (Signed)
Dental Antibiotics:  In most cases prophylactic antibiotics for Dental procdeures after total joint surgery are not necessary.  Exceptions are as follows:  1. History of prior total joint infection  2. Severely immunocompromised (Organ Transplant, cancer chemotherapy, Rheumatoid biologic meds such as Humera)  3. Poorly controlled diabetes (A1C &gt; 8.0, blood glucose over 200)  If you have one of these conditions, contact your surgeon for an antibiotic prescription, prior to your dental procedure. INSTRUCTIONS AFTER JOINT REPLACEMENT   Remove items at home which could result in a fall. This includes throw rugs or furniture in walking pathways ICE to the affected joint every three hours while awake for 30 minutes at a time, for at least the first 3-5 days, and then as needed for pain and swelling.  Continue to use ice for pain and swelling. You may notice swelling that will progress down to the foot and ankle.  This is normal after surgery.  Elevate your leg when you are not up walking on it.   Continue to use the breathing machine you got in the hospital (incentive spirometer) which will help keep your temperature down.  It is common for your temperature to cycle up and down following surgery, especially at night when you are not up moving around and exerting yourself.  The breathing machine keeps your lungs expanded and your temperature down.   DIET:  As you were doing prior to hospitalization, we recommend a well-balanced diet.  DRESSING / WOUND CARE / SHOWERING  Keep the surgical dressing until follow up.  The dressing is water proof, so you can shower without any extra covering.  IF THE DRESSING FALLS OFF or the wound gets wet inside, change the dressing with sterile gauze.  Please use good hand washing techniques before changing the dressing.  Do not use any lotions or creams on the incision until instructed by your surgeon.    ACTIVITY  Increase activity slowly as tolerated, but  follow the weight bearing instructions below.   No driving for 6 weeks or until further direction given by your physician.  You cannot drive while taking narcotics.  No lifting or carrying greater than 10 lbs. until further directed by your surgeon. Avoid periods of inactivity such as sitting longer than an hour when not asleep. This helps prevent blood clots.  You may return to work once you are authorized by your doctor.     WEIGHT BEARING   Weight bearing as tolerated with assist device (walker, cane, etc) as directed, use it as long as suggested by your surgeon or therapist, typically at least 4-6 weeks.   EXERCISES  Results after joint replacement surgery are often greatly improved when you follow the exercise, range of motion and muscle strengthening exercises prescribed by your doctor. Safety measures are also important to protect the joint from further injury. Any time any of these exercises cause you to have increased pain or swelling, decrease what you are doing until you are comfortable again and then slowly increase them. If you have problems or questions, call your caregiver or physical therapist for advice.   Rehabilitation is important following a joint replacement. After just a few days of immobilization, the muscles of the leg can become weakened and shrink (atrophy).  These exercises are designed to build up the tone and strength of the thigh and leg muscles and to improve motion. Often times heat used for twenty to thirty minutes before working out will loosen up your tissues and help with   improving the range of motion but do not use heat for the first two weeks following surgery (sometimes heat can increase post-operative swelling).   These exercises can be done on a training (exercise) mat, on the floor, on a table or on a bed. Use whatever works the best and is most comfortable for you.    Use music or television while you are exercising so that the exercises are a pleasant  break in your day. This will make your life better with the exercises acting as a break in your routine that you can look forward to.   Perform all exercises about fifteen times, three times per day or as directed.  You should exercise both the operative leg and the other leg as well.  Exercises include:   Quad Sets - Tighten up the muscle on the front of the thigh (Quad) and hold for 5-10 seconds.   Straight Leg Raises - With your knee straight (if you were given a brace, keep it on), lift the leg to 60 degrees, hold for 3 seconds, and slowly lower the leg.  Perform this exercise against resistance later as your leg gets stronger.  Leg Slides: Lying on your back, slowly slide your foot toward your buttocks, bending your knee up off the floor (only go as far as is comfortable). Then slowly slide your foot back down until your leg is flat on the floor again.  Angel Wings: Lying on your back spread your legs to the side as far apart as you can without causing discomfort.  Hamstring Strength:  Lying on your back, push your heel against the floor with your leg straight by tightening up the muscles of your buttocks.  Repeat, but this time bend your knee to a comfortable angle, and push your heel against the floor.  You may put a pillow under the heel to make it more comfortable if necessary.   A rehabilitation program following joint replacement surgery can speed recovery and prevent re-injury in the future due to weakened muscles. Contact your doctor or a physical therapist for more information on knee rehabilitation.    CONSTIPATION  Constipation is defined medically as fewer than three stools per week and severe constipation as less than one stool per week.  Even if you have a regular bowel pattern at home, your normal regimen is likely to be disrupted due to multiple reasons following surgery.  Combination of anesthesia, postoperative narcotics, change in appetite and fluid intake all can affect your  bowels.   YOU MUST use at least one of the following options; they are listed in order of increasing strength to get the job done.  They are all available over the counter, and you may need to use some, POSSIBLY even all of these options:    Drink plenty of fluids (prune juice may be helpful) and high fiber foods Colace 100 mg by mouth twice a day  Senokot for constipation as directed and as needed Dulcolax (bisacodyl), take with full glass of water  Miralax (polyethylene glycol) once or twice a day as needed.  If you have tried all these things and are unable to have a bowel movement in the first 3-4 days after surgery call either your surgeon or your primary doctor.    If you experience loose stools or diarrhea, hold the medications until you stool forms back up.  If your symptoms do not get better within 1 week or if they get worse, check with your doctor.    If you experience "the worst abdominal pain ever" or develop nausea or vomiting, please contact the office immediately for further recommendations for treatment.   ITCHING:  If you experience itching with your medications, try taking only a single pain pill, or even half a pain pill at a time.  You can also use Benadryl over the counter for itching or also to help with sleep.   TED HOSE STOCKINGS:  Use stockings on both legs until for at least 2 weeks or as directed by physician office. They may be removed at night for sleeping.  MEDICATIONS:  See your medication summary on the "After Visit Summary" that nursing will review with you.  You may have some home medications which will be placed on hold until you complete the course of blood thinner medication.  It is important for you to complete the blood thinner medication as prescribed.  PRECAUTIONS:  If you experience chest pain or shortness of breath - call 911 immediately for transfer to the hospital emergency department.   If you develop a fever greater that 101 F, purulent drainage  from wound, increased redness or drainage from wound, foul odor from the wound/dressing, or calf pain - CONTACT YOUR SURGEON.                                                   FOLLOW-UP APPOINTMENTS:  If you do not already have a post-op appointment, please call the office for an appointment to be seen by your surgeon.  Guidelines for how soon to be seen are listed in your "After Visit Summary", but are typically between 1-4 weeks after surgery.  OTHER INSTRUCTIONS:   Knee Replacement:  Do not place pillow under knee, focus on keeping the knee straight while resting. CPM instructions: 0-90 degrees, 2 hours in the morning, 2 hours in the afternoon, and 2 hours in the evening. Place foam block, curve side up under heel at all times except when in CPM or when walking.  DO NOT modify, tear, cut, or change the foam block in any way.  POST-OPERATIVE OPIOID TAPER INSTRUCTIONS: It is important to wean off of your opioid medication as soon as possible. If you do not need pain medication after your surgery it is ok to stop day one. Opioids include: Codeine, Hydrocodone(Norco, Vicodin), Oxycodone(Percocet, oxycontin) and hydromorphone amongst others.  Long term and even short term use of opiods can cause: Increased pain response Dependence Constipation Depression Respiratory depression And more.  Withdrawal symptoms can include Flu like symptoms Nausea, vomiting And more Techniques to manage these symptoms Hydrate well Eat regular healthy meals Stay active Use relaxation techniques(deep breathing, meditating, yoga) Do Not substitute Alcohol to help with tapering If you have been on opioids for less than two weeks and do not have pain than it is ok to stop all together.  Plan to wean off of opioids This plan should start within one week post op of your joint replacement. Maintain the same interval or time between taking each dose and first decrease the dose.  Cut the total daily intake of  opioids by one tablet each day Next start to increase the time between doses. The last dose that should be eliminated is the evening dose.   MAKE SURE YOU:  Understand these instructions.  Get help right away if you are not doing   well or get worse.    Thank you for letting us be a part of your medical care team.  It is a privilege we respect greatly.  We hope these instructions will help you stay on track for a fast and full recovery!       

## 2021-06-22 ENCOUNTER — Encounter (HOSPITAL_COMMUNITY): Payer: Self-pay | Admitting: Orthopaedic Surgery

## 2021-06-27 ENCOUNTER — Ambulatory Visit (INDEPENDENT_AMBULATORY_CARE_PROVIDER_SITE_OTHER): Payer: Medicare Other | Admitting: Orthopaedic Surgery

## 2021-06-27 ENCOUNTER — Other Ambulatory Visit: Payer: Self-pay

## 2021-06-27 ENCOUNTER — Encounter: Payer: Self-pay | Admitting: Orthopaedic Surgery

## 2021-06-27 DIAGNOSIS — Z96641 Presence of right artificial hip joint: Secondary | ICD-10-CM

## 2021-06-27 NOTE — Progress Notes (Deleted)
Office Visit Note   Patient: [name]           Date of Birth: 09/01/1950           MRN: [id number] Visit Date: 06/27/2021              Requested by: Ignatius Specking, MD [address] [place],  [place] [address] PCP: Ignatius Specking, MD   Assessment & Plan: Visit Diagnoses:  1. Status post total replacement of right hip     Plan: ***  Follow-Up Instructions: Return in about 4 weeks (around 07/25/2021).   Orders:  No orders of the defined types were placed in this encounter.  No orders of the defined types were placed in this encounter.     Procedures: No procedures performed   Clinical Data: No additional findings.   Subjective: Chief Complaint  Patient presents with   Right Hip - Routine Post Op    HPI  Review of Systems   Objective: Vital Signs: There were no vitals taken for this visit.  Physical Exam  Ortho Exam  Specialty Comments:  multiple docs filling her Hydrocodone, no more from orthopedics   Imaging: No results found.   PMFS History: Patient Active Problem List   Diagnosis Date Noted   Status post total replacement of right hip 06/14/2021   Primary osteoarthritis of right hip 08/02/2020   Status post total right knee replacement 05/18/2020   Status post total left knee replacement 12/16/2019   Unilateral primary osteoarthritis, left knee 10/29/2019   Unilateral primary osteoarthritis, right knee 10/29/2019   Past Medical History:  Diagnosis Date   Anxiety    Arthritis    COPD (chronic obstructive pulmonary disease) (HCC)    Depression    GERD (gastroesophageal reflux disease)    History of hiatal hernia    RLS (restless legs syndrome)     Family History  Problem Relation Age of Onset   Hypertension Mother    Healthy Sister    Breast cancer Other     Past Surgical History:  Procedure Laterality Date   BREAST EXCISIONAL BIOPSY Right 1990   X2 -benign   BREAST SURGERY     lumpectomy   CHOLECYSTECTOMY     JOINT  REPLACEMENT     TONSILLECTOMY     TOTAL HIP ARTHROPLASTY Right 06/14/2021   Procedure: Right TOTAL HIP ARTHROPLASTY ANTERIOR APPROACH;  Surgeon: Kathryne Hitch, MD;  Location: [facility] OR;  Service: Orthopedics;  Laterality: Right;   TOTAL KNEE ARTHROPLASTY Left 12/16/2019   Procedure: LEFT TOTAL KNEE ARTHROPLASTY;  Surgeon: Kathryne Hitch, MD;  Location: [facility] OR;  Service: Orthopedics;  Laterality: Left;   TOTAL KNEE ARTHROPLASTY Right 05/18/2020   Procedure: RIGHT TOTAL KNEE ARTHROPLASTY;  Surgeon: Kathryne Hitch, MD;  Location: [facility];  Service: Orthopedics;  Laterality: Right;   Social History   Occupational History   Not on file  Tobacco Use   Smoking status: Former    Packs/day: 0.50    Years: 40.00    Pack years: 20.00    Types: Cigarettes    Quit date: 09/17/2019    Years since quitting: 1.7   Smokeless tobacco: Never  Vaping Use   Vaping Use: Never used  Substance and Sexual Activity   Alcohol use: Never   Drug use: Never   Sexual activity: Not on file       The patient is a 71 year old who is now 2 weeks status post a right total  hip arthroplasty.  She is ambulating with a walker.  She has not been taking aspirin since she has low platelets.  Overall though she is pleased.  She reports good range of motion and strength.  Examination right hip shows the incision looks great.  The staples were removed and Steri-Strips applied.  There is about 30 cc of seroma that I did aspirate.  She will continue to increase her activities as comfort allows.  I would like to see her back in a month to make sure she is doing well but no x-rays are needed.

## 2021-06-27 NOTE — Progress Notes (Signed)
The patient is 2 weeks status post a right total hip arthroplasty.  She is 71 years old.  She reports that she is doing well.  She has had home therapy.  She is using a walker and ready to transition to a cane.  She is not taking aspirin since she has low platelets.  Her right hip incision looks great.  We remove the staples and place Steri-Strips.  She had a seroma but only aspirated about 30 cc from this.  Her calf is soft.  She will continue increase her activities as comfort allows.  I would like to see her back in a month to make sure she is doing well but no x-rays are needed.

## 2021-07-25 ENCOUNTER — Encounter: Payer: Medicare Other | Admitting: Orthopaedic Surgery

## 2021-08-03 ENCOUNTER — Ambulatory Visit (INDEPENDENT_AMBULATORY_CARE_PROVIDER_SITE_OTHER): Payer: Medicare Other | Admitting: Orthopaedic Surgery

## 2021-08-03 ENCOUNTER — Encounter: Payer: Self-pay | Admitting: Orthopaedic Surgery

## 2021-08-03 DIAGNOSIS — Z96641 Presence of right artificial hip joint: Secondary | ICD-10-CM

## 2021-08-03 NOTE — Progress Notes (Signed)
The patient is now 7 weeks status post a right hip replacement.  We have replaced both her knees.  She says her right knee is feeling tight but overall she is doing well.  She is a very active 71 year old female.  She been riding her bicycle a lot.  She denies any significant issues. ? ?Her right operative hip moves smoothly and fluidly.  She walks without any significant limp.  Both her operative total knee replacements do feel stiff.  She seems to be doing well overall. ? ?All questions and concerns were answered and addressed.  We will see her back in 6 months to see how she is doing overall.  We will have a standing low AP pelvis and lateral of her right hip at that visit. ?

## 2021-09-09 ENCOUNTER — Other Ambulatory Visit: Payer: Self-pay | Admitting: Physician Assistant

## 2022-02-06 ENCOUNTER — Ambulatory Visit: Payer: Medicare Other | Admitting: Orthopaedic Surgery

## 2022-02-19 ENCOUNTER — Other Ambulatory Visit: Payer: Self-pay | Admitting: Orthopaedic Surgery

## 2022-02-20 ENCOUNTER — Ambulatory Visit: Payer: Medicare Other | Admitting: Orthopaedic Surgery

## 2022-03-02 ENCOUNTER — Ambulatory Visit: Payer: Medicare Other | Admitting: Orthopaedic Surgery

## 2022-03-22 IMAGING — MG MM DIGITAL SCREENING BILAT W/ TOMO AND CAD
8 series · 8 of 24 positions shown · non-contrast
Comparison: Previous exam(s).

CLINICAL DATA: Screening.

EXAM:
DIGITAL SCREENING BILATERAL MAMMOGRAM WITH TOMOSYNTHESIS AND CAD
TECHNIQUE: Bilateral screening digital craniocaudal and mediolateral oblique
mammograms were obtained. Bilateral screening digital breast
tomosynthesis was performed. The images were evaluated with
computer-aided detection.

[R MLO synth-2D]
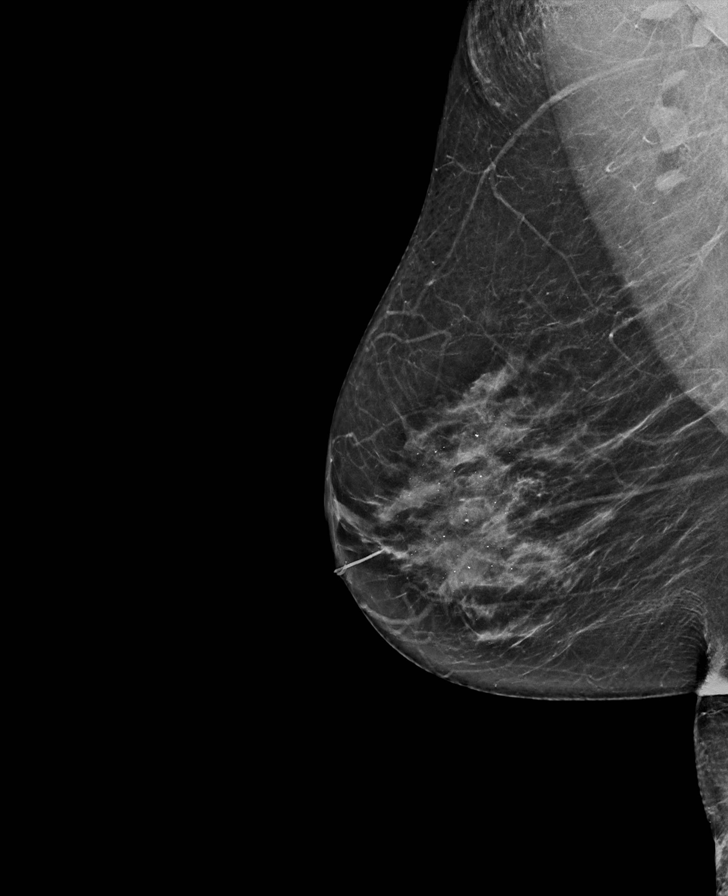

[L CC synth-2D]
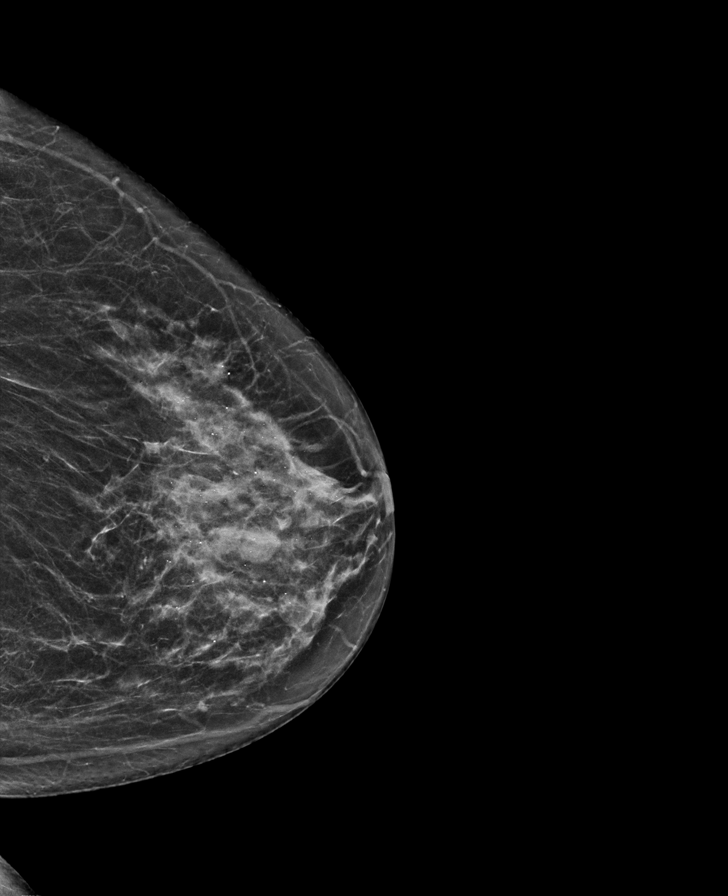

[L MLO synth-2D]
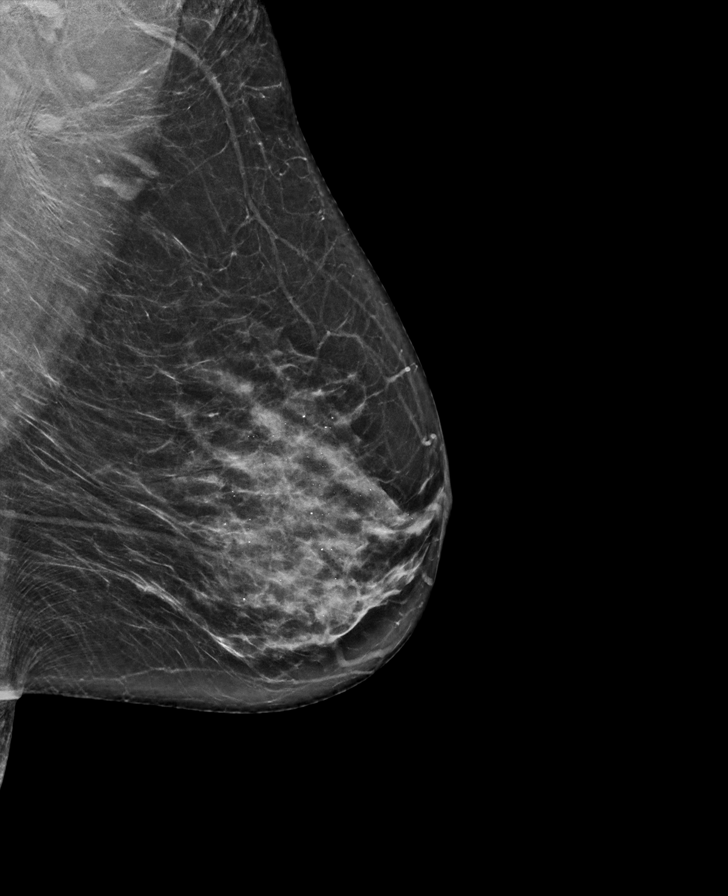

[R CC synth-2D]
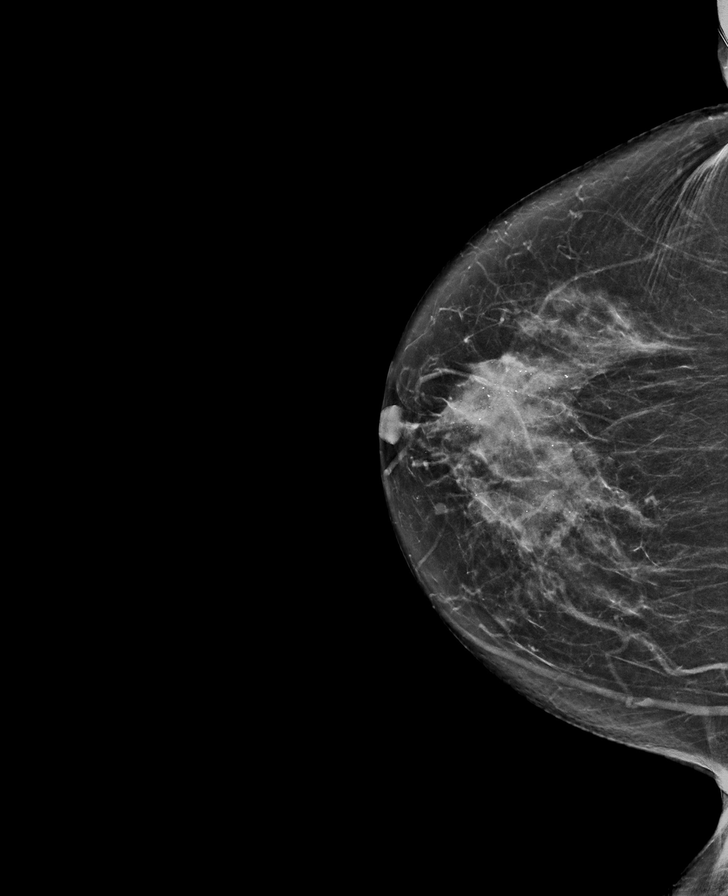

[L MLO tomo · tomo slice 34/67.0]
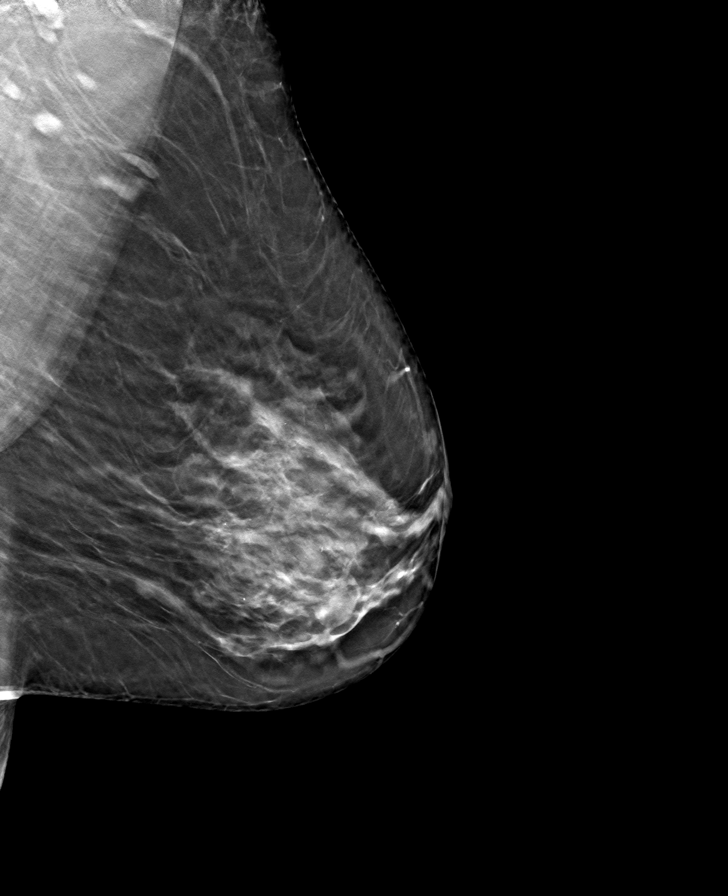

[L CC tomo · tomo slice 31/60.0]
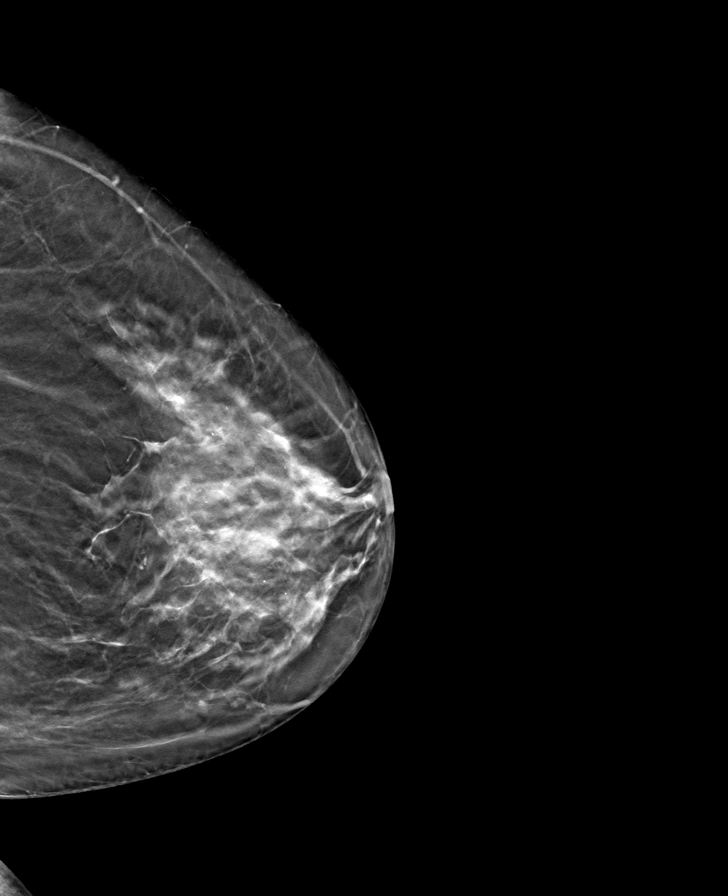

[R CC tomo · tomo slice 33/64.0]
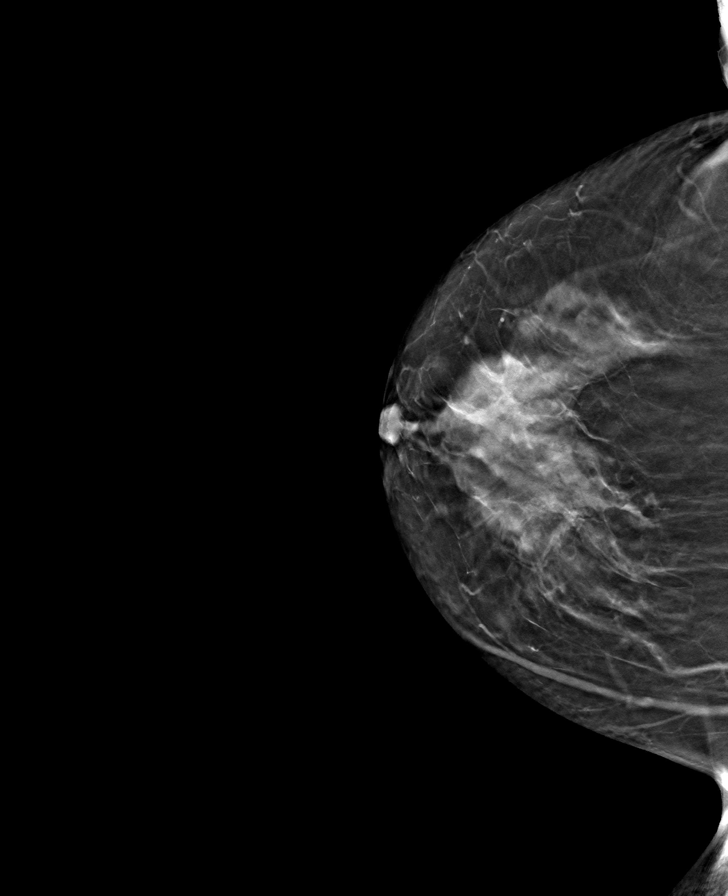

[R MLO tomo · tomo slice 33/65.0]
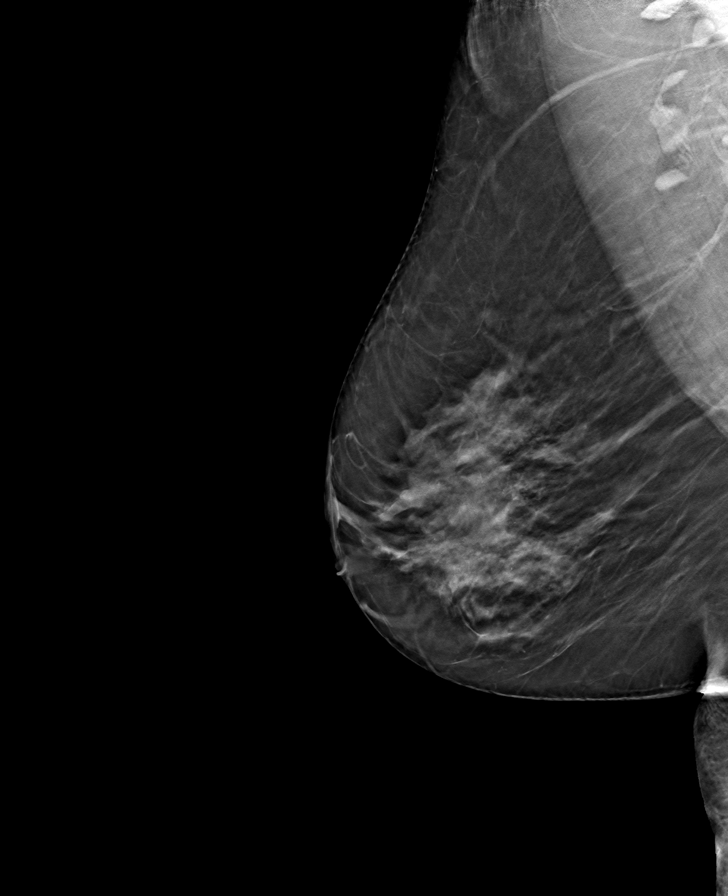

[8 of 24 positions shown; findings below may reference images not displayed]

ACR Breast Density Category c: The breast tissue is heterogeneously
dense, which may obscure small masses.
FINDINGS: In the left breast, a possible mass warrants further evaluation. In
the right breast, no findings suspicious for malignancy.
IMPRESSION: Further evaluation is suggested for a possible mass in the left
breast.

RECOMMENDATION:
Diagnostic mammogram and possibly ultrasound of the left breast.
(Code:[id number])

The patient will be contacted regarding the findings, and additional
imaging will be scheduled.

BI-RADS CATEGORY  0: Incomplete. Need additional imaging evaluation
and/or prior mammograms for comparison.

## 2022-07-06 NOTE — Telephone Encounter (Signed)
Error

## 2022-07-13 ENCOUNTER — Encounter: Payer: Self-pay | Admitting: Radiology

## 2022-08-27 IMAGING — CR DG PORTABLE PELVIS
1 series · 1 of 1 positions shown · non-contrast
Comparison: Radiographs 08/02/2020. Intraoperative views earlier
the same date.

CLINICAL DATA: Postop right anterior hip replacement.

EXAM:
PORTABLE PELVIS 1-2 VIEWS

[[person_name] view]
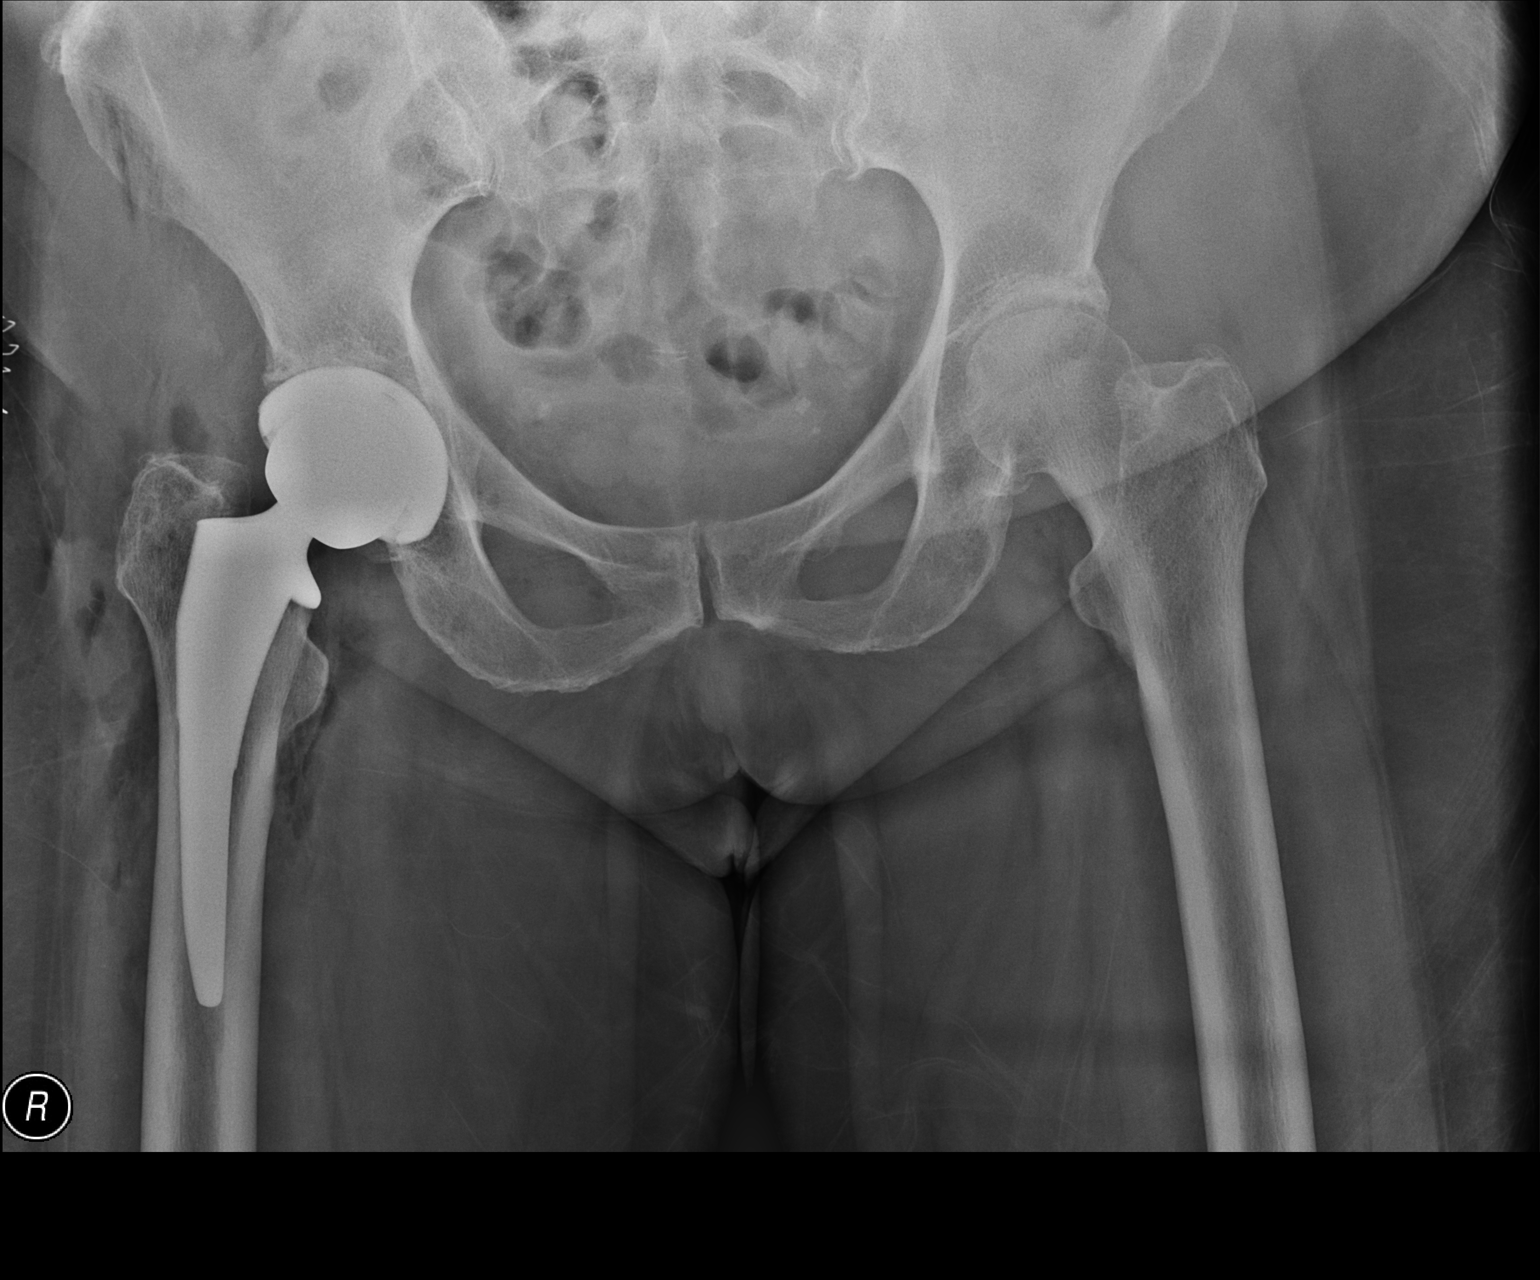

[1 of 1 positions shown; findings below may reference images not displayed]

FINDINGS: 1117 hours. AP view of the lower pelvis demonstrates right total hip
replacement in good position. No evidence of acute fracture or
dislocation. There is a small amount of gas within the right hip
joint and surrounding soft tissues. There are mild left hip
degenerative changes.
IMPRESSION: Status post right total hip arthroplasty without demonstrated
complication.

## 2022-08-27 IMAGING — RF DG HIP (WITH OR WITHOUT PELVIS) 1V*R*
1 series · 4 of 4 positions shown · non-contrast
Comparison: Radiographs 08/02/2020.

CLINICAL DATA: Postop right anterior hip replacement.

EXAM:
DG HIP (WITH OR WITHOUT PELVIS) 1V RIGHT

[Series 1: dg x-ray · right · 0.20mm/px · 4 of 4 slices shown]
[im 1/4]
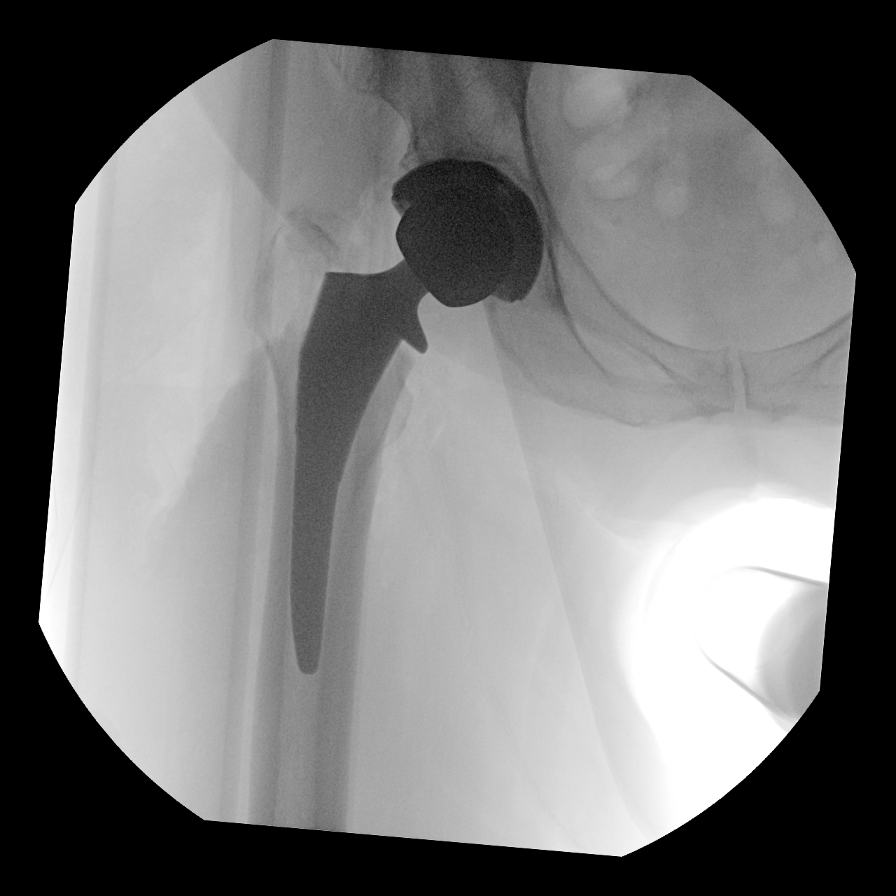
[im 2/4]
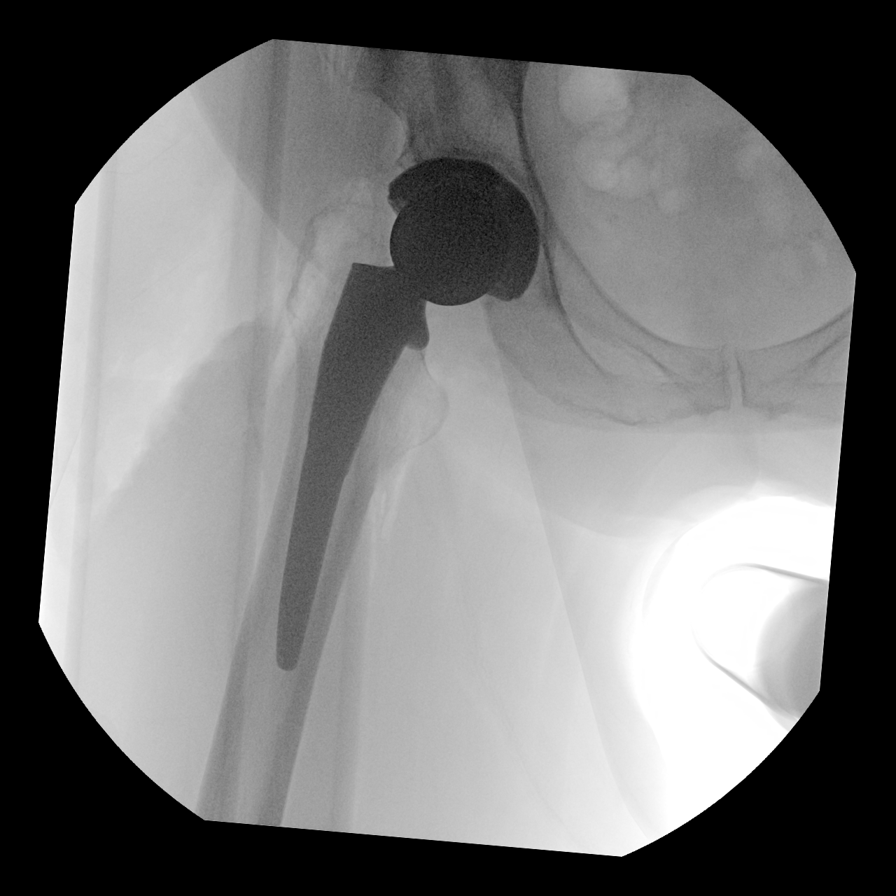
[im 3/4]
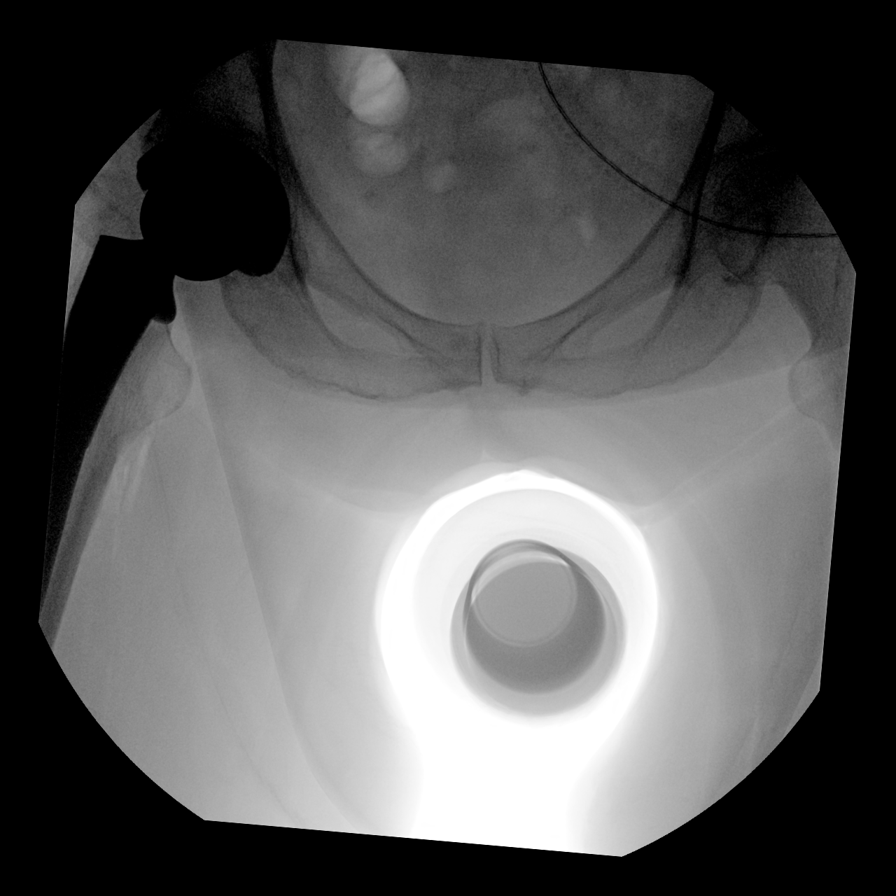
[im 4/4]
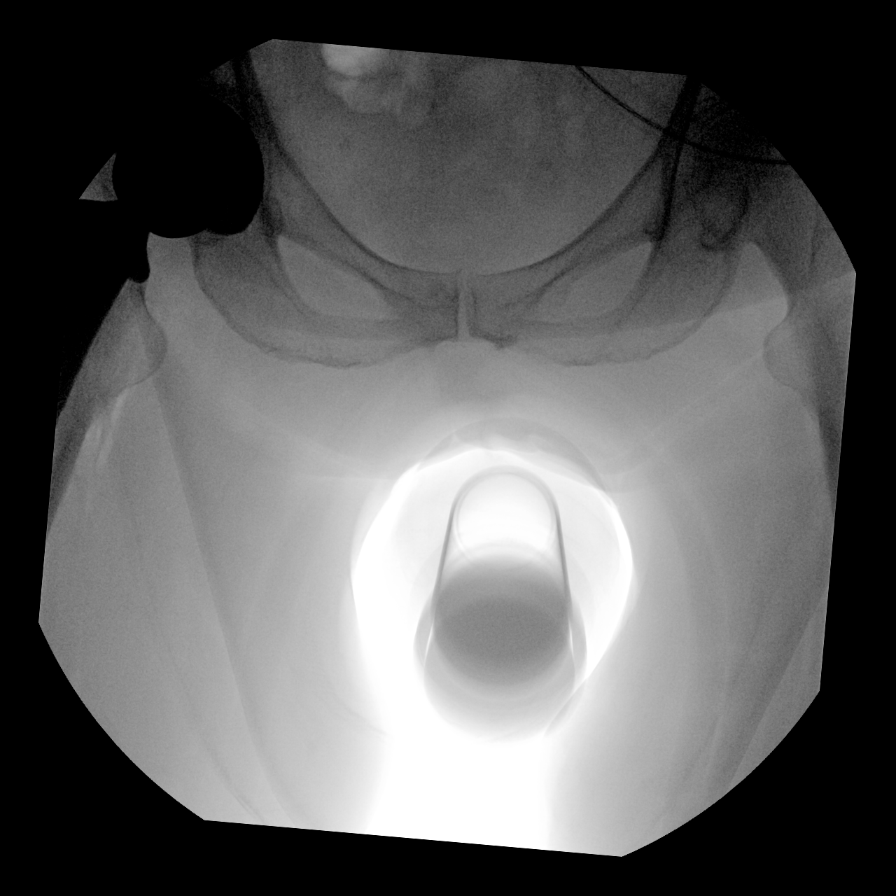

[4 of 4 positions shown; findings below may reference images not displayed]

FINDINGS: C-arm fluoroscopy provided in the operating [HOSPITAL] seconds of
fluoroscopy time. 2.47 mGy.

4 spot fluoroscopic images demonstrate interval right total hip
arthroplasty without apparent complication. The hardware is well
positioned. There is no evidence of acute fracture or dislocation.
There is gas within the surgical bed.
IMPRESSION: Intraoperative views following right total hip arthroplasty. No
demonstrated complication.
